# Patient Record
Sex: Male | Born: 1975 | Race: White | Hispanic: No | Marital: Married | Smoking: Former smoker
Health system: Southern US, Community
[De-identification: ages and names within clinical notes are randomized; demographics above are authoritative.]

## PROBLEM LIST (undated history)

## (undated) DIAGNOSIS — M19042 Primary osteoarthritis, left hand: Secondary | ICD-10-CM

## (undated) DIAGNOSIS — M722 Plantar fascial fibromatosis: Secondary | ICD-10-CM

## (undated) DIAGNOSIS — M5134 Other intervertebral disc degeneration, thoracic region: Secondary | ICD-10-CM

## (undated) DIAGNOSIS — K529 Noninfective gastroenteritis and colitis, unspecified: Secondary | ICD-10-CM

## (undated) DIAGNOSIS — F419 Anxiety disorder, unspecified: Secondary | ICD-10-CM

## (undated) DIAGNOSIS — M5136 Other intervertebral disc degeneration, lumbar region: Principal | ICD-10-CM

## (undated) DIAGNOSIS — S82899A Other fracture of unspecified lower leg, initial encounter for closed fracture: Secondary | ICD-10-CM

## (undated) DIAGNOSIS — M19041 Primary osteoarthritis, right hand: Secondary | ICD-10-CM

## (undated) DIAGNOSIS — M5030 Other cervical disc degeneration, unspecified cervical region: Secondary | ICD-10-CM

## (undated) DIAGNOSIS — I10 Essential (primary) hypertension: Secondary | ICD-10-CM

## (undated) HISTORY — PX: ANKLE FRACTURE SURGERY: SHX122

## (undated) HISTORY — DX: Other fracture of unspecified lower leg, initial encounter for closed fracture: S82.899A

## (undated) HISTORY — DX: Essential (primary) hypertension: I10

## (undated) HISTORY — DX: Other intervertebral disc degeneration, lumbar region: M51.36

## (undated) HISTORY — DX: Noninfective gastroenteritis and colitis, unspecified: K52.9

## (undated) HISTORY — DX: Primary osteoarthritis, left hand: M19.042

## (undated) HISTORY — DX: Anxiety disorder, unspecified: F41.9

## (undated) HISTORY — DX: Primary osteoarthritis, right hand: M19.041

## (undated) HISTORY — DX: Other cervical disc degeneration, unspecified cervical region: M50.30

## (undated) HISTORY — DX: Other intervertebral disc degeneration, thoracic region: M51.34

## (undated) HISTORY — DX: Plantar fascial fibromatosis: M72.2

---

## 1997-09-10 DIAGNOSIS — S82899A Other fracture of unspecified lower leg, initial encounter for closed fracture: Secondary | ICD-10-CM

## 1997-09-10 HISTORY — DX: Other fracture of unspecified lower leg, initial encounter for closed fracture: S82.899A

## 2011-02-25 ENCOUNTER — Emergency Department (HOSPITAL_COMMUNITY): Payer: Managed Care, Other (non HMO)

## 2011-02-25 ENCOUNTER — Emergency Department (HOSPITAL_COMMUNITY)
Admission: EM | Admit: 2011-02-25 | Discharge: 2011-02-26 | Disposition: A | Discharge status: Home / Self Care | Payer: Managed Care, Other (non HMO) | Attending: Emergency Medicine | Admitting: Emergency Medicine

## 2011-02-25 DIAGNOSIS — N5089 Other specified disorders of the male genital organs: Secondary | ICD-10-CM | POA: Insufficient documentation

## 2011-02-25 DIAGNOSIS — M129 Arthropathy, unspecified: Secondary | ICD-10-CM | POA: Insufficient documentation

## 2011-02-25 DIAGNOSIS — Z79899 Other long term (current) drug therapy: Secondary | ICD-10-CM | POA: Insufficient documentation

## 2011-02-25 DIAGNOSIS — N1330 Unspecified hydronephrosis: Secondary | ICD-10-CM | POA: Insufficient documentation

## 2011-02-25 DIAGNOSIS — F319 Bipolar disorder, unspecified: Secondary | ICD-10-CM | POA: Insufficient documentation

## 2011-02-25 DIAGNOSIS — N201 Calculus of ureter: Secondary | ICD-10-CM | POA: Insufficient documentation

## 2011-02-25 DIAGNOSIS — R112 Nausea with vomiting, unspecified: Secondary | ICD-10-CM | POA: Insufficient documentation

## 2011-02-25 DIAGNOSIS — R109 Unspecified abdominal pain: Secondary | ICD-10-CM | POA: Insufficient documentation

## 2011-02-25 DIAGNOSIS — N509 Disorder of male genital organs, unspecified: Secondary | ICD-10-CM | POA: Insufficient documentation

## 2011-02-25 LAB — BASIC METABOLIC PANEL
BUN: 15 mg/dL (ref 6–23)
CO2: 28 mEq/L (ref 19–32)
Calcium: 8.9 mg/dL (ref 8.4–10.5)
Chloride: 104 mEq/L (ref 96–112)
Creatinine, Ser: 0.74 mg/dL (ref 0.50–1.35)
Glucose, Bld: 98 mg/dL (ref 70–99)

## 2011-02-25 LAB — CBC
Hemoglobin: 14.4 g/dL (ref 13.0–17.0)
MCH: 29 pg (ref 26.0–34.0)
MCHC: 35.2 g/dL (ref 30.0–36.0)
MCV: 82.5 fL (ref 78.0–100.0)
Platelets: 196 10*3/uL (ref 150–400)
RBC: 4.96 MIL/uL (ref 4.22–5.81)

## 2011-02-25 LAB — DIFFERENTIAL
Basophils Relative: 0 % (ref 0–1)
Eosinophils Absolute: 0.1 10*3/uL (ref 0.0–0.7)
Lymphs Abs: 1.3 10*3/uL (ref 0.7–4.0)
Monocytes Absolute: 0.4 10*3/uL (ref 0.1–1.0)
Monocytes Relative: 4 % (ref 3–12)
Neutrophils Relative %: 80 % — ABNORMAL HIGH (ref 43–77)

## 2011-02-25 LAB — URINALYSIS, ROUTINE W REFLEX MICROSCOPIC
Bilirubin Urine: NEGATIVE
Glucose, UA: NEGATIVE mg/dL
Ketones, ur: NEGATIVE mg/dL
Protein, ur: NEGATIVE mg/dL
pH: 7.5 (ref 5.0–8.0)

## 2011-02-26 MED ORDER — IOHEXOL 300 MG/ML  SOLN
100.0000 mL | Freq: Once | INTRAMUSCULAR | Status: AC | PRN
  Administered 2011-02-26: 100 mL via INTRAVENOUS

## 2011-12-15 IMAGING — US US SCROTUM
1 series · 14 of 25 positions shown · non-contrast
Comparison: None.

CLINICAL DATA: Right testicular pain.

SCROTAL ULTRASOUND
DOPPLER ULTRASOUND OF THE TESTICLES
TECHNIQUE: Complete ultrasound examination of the testicles,
epididymis, and other scrotal structures was performed.  Color and
spectral Doppler ultrasound were also utilized to evaluate blood
flow to the testicles.

[Series 1: us scrotum · 0.08mm/px · 14 of 36 slices shown]
[im 1/36]
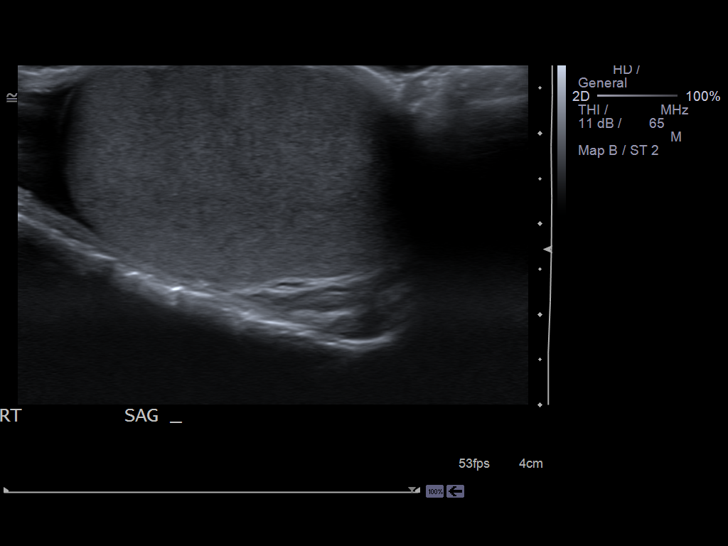
[im 3/36]
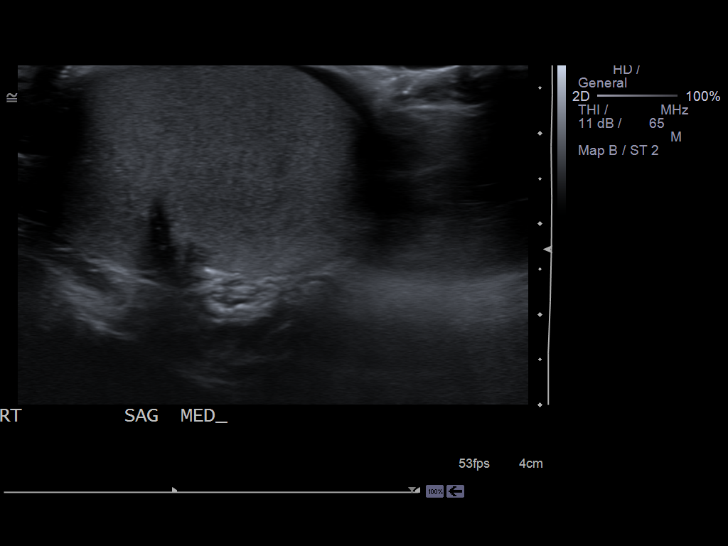
[im 6/36]
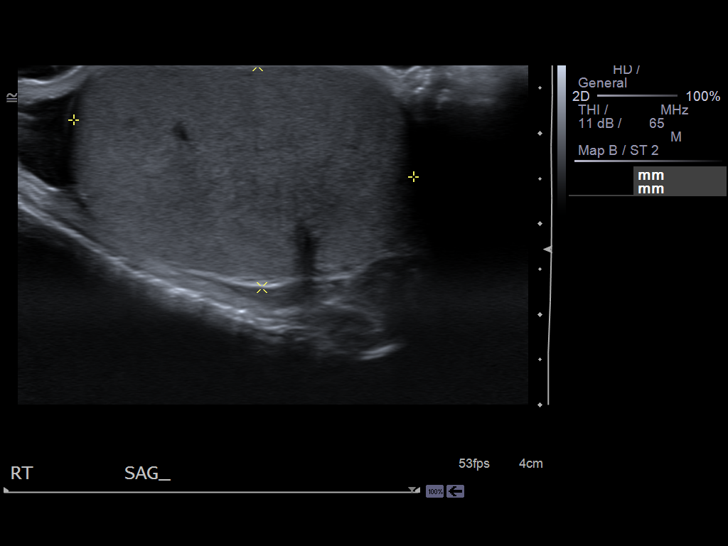
[im 9/36]
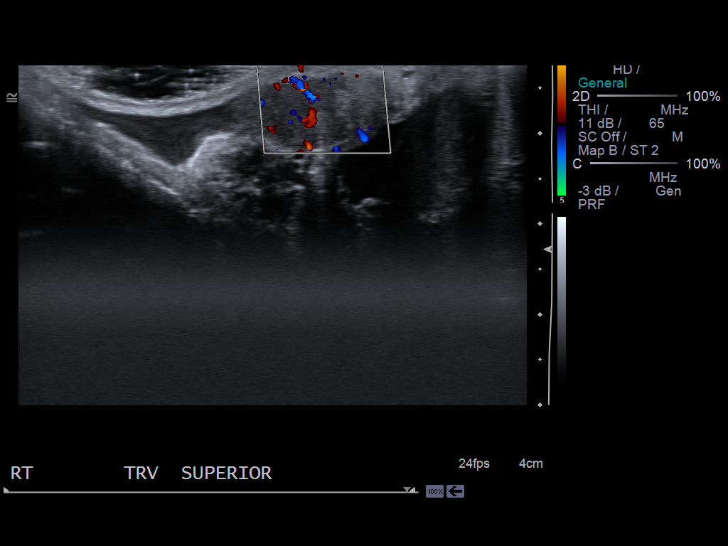
[im 12/36]
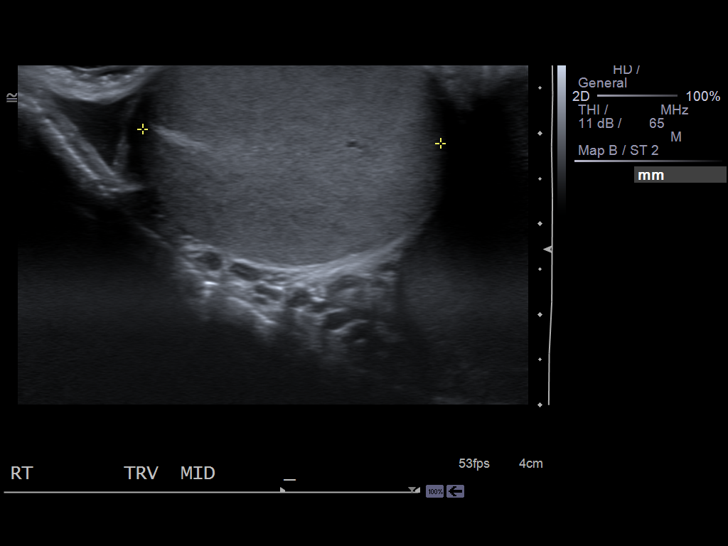
[im 14/36]
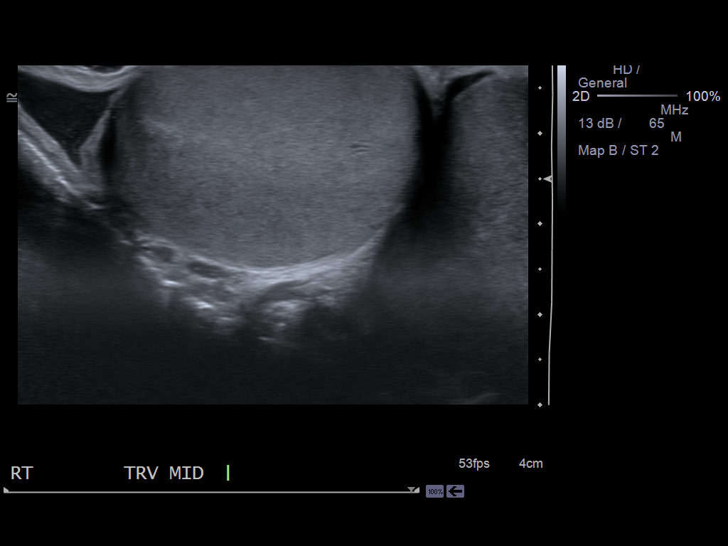
[im 17/36]
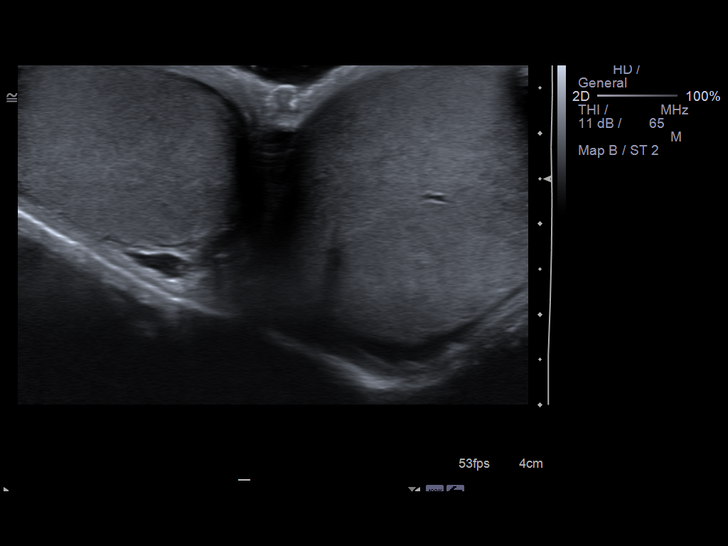
[im 19/36]
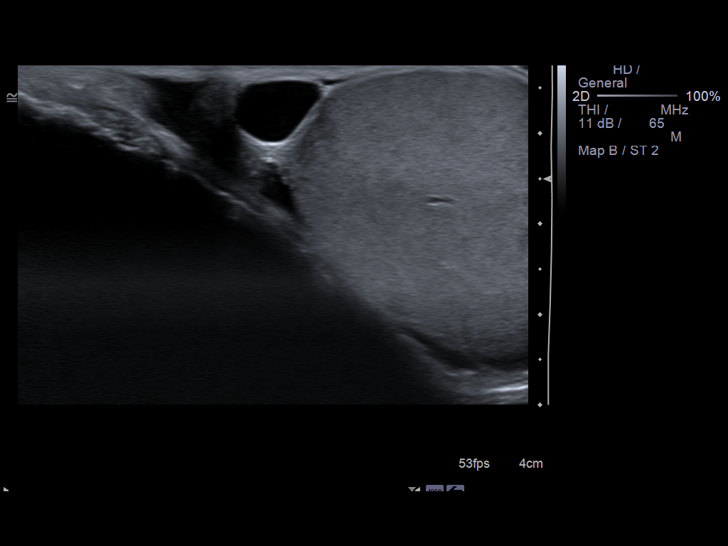
[im 22/36]
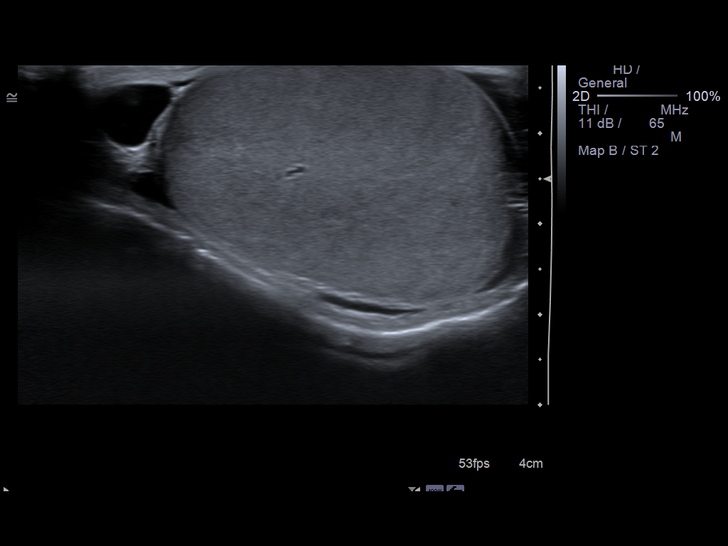
[im 24/36]
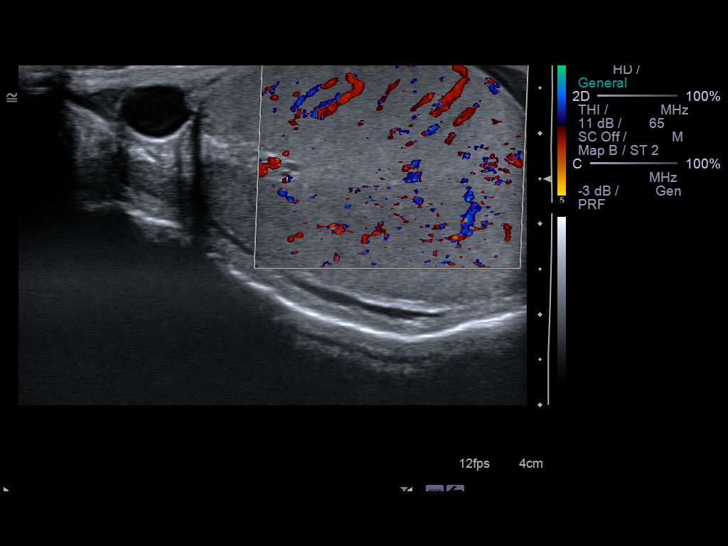
[im 27/36]
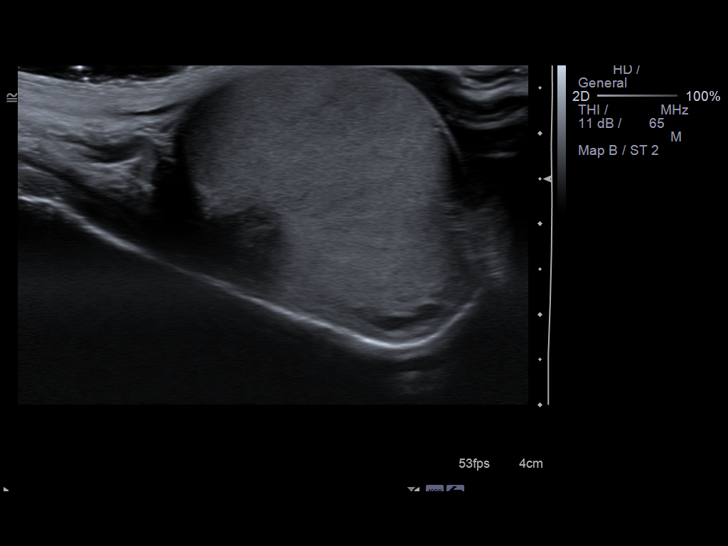
[im 30/36]
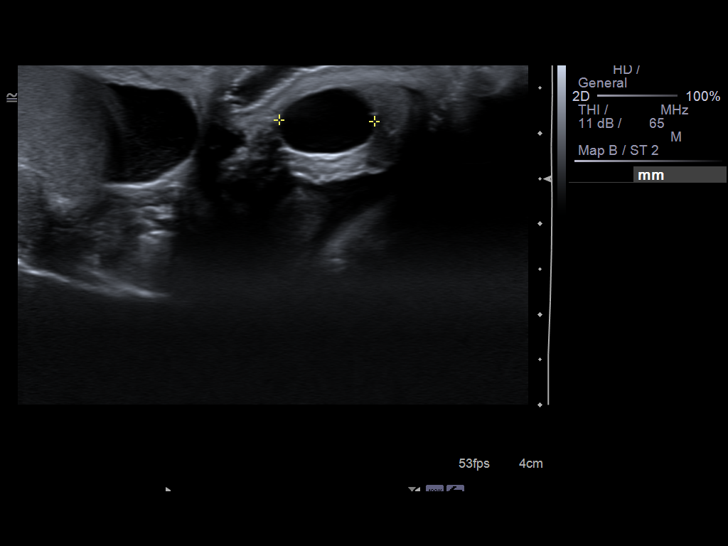
[im 33/36]
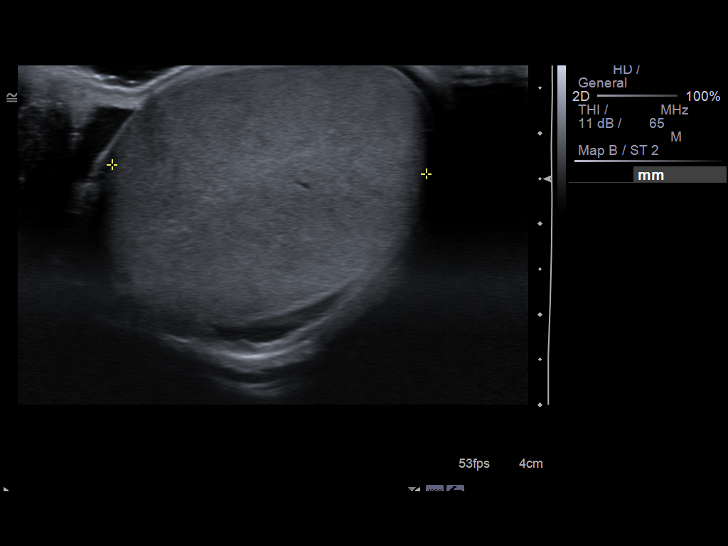
[im 36/36]
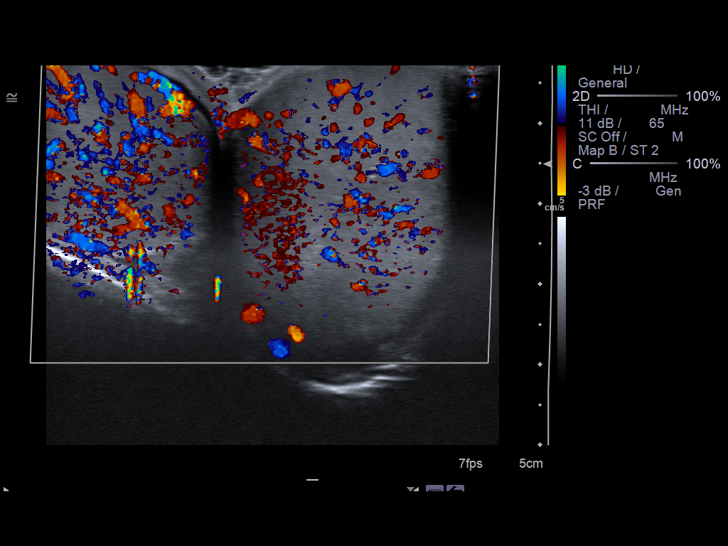

[14 of 25 positions shown; findings below may reference images not displayed]

FINDINGS: Right testis:  Measures 3.8 x 2.5 x 3.3 cm.  The testicle
demonstrates homogeneous, normal echotexture.

Left testis:  Measures 4.2 x 2.6 x 3.5 cm.  The testicle
demonstrates homogeneous, normal echotexture.

Right epididymis:  Normal in size and appearance.

Left epididymis:  Small cyst in the head of the left epididymis is
noted.  Left epididymis otherwise unremarkable.

Hydocele:  present/absent.

Varicocele:  present/absent.

Pulsed Doppler interrogation of both testes demonstrates low
resistance flow bilaterally.
IMPRESSION: No acute finding or finding to explain the patient's symptoms.

## 2011-12-15 IMAGING — CT CT ABD-PELV W/ CM
3 of 4 series · 14 of 32 positions shown, 19 images · IV contrast (water & 100ml omni 300)
Comparison: None.

CLINICAL DATA: Right side abdominal pain.

CT ABDOMEN AND PELVIS WITH CONTRAST
TECHNIQUE: Multidetector CT imaging of the abdomen and pelvis was
performed following the standard protocol during bolus
administration of intravenous contrast.
Contrast: 100 ml 4mnipaque-MFF.

[Series 2: routine abdomen · axial · 0.74mm/px · z∈[-423,-153]mm · 4 of 90 slices shown, 9 images]
[im 18/90  soft-tissue]
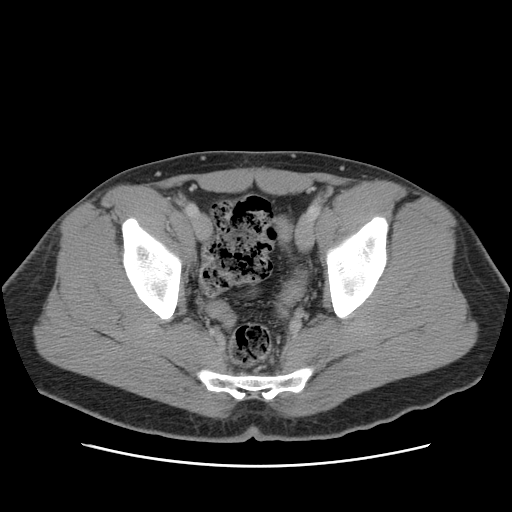
[im 18/90  lung]
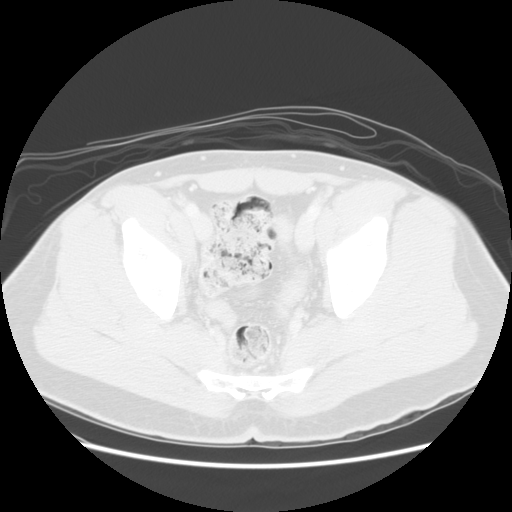
[im 18/90  bone]
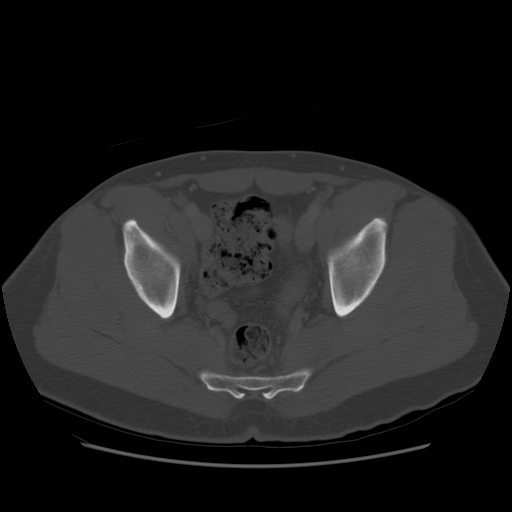
[im 36/90  soft-tissue]
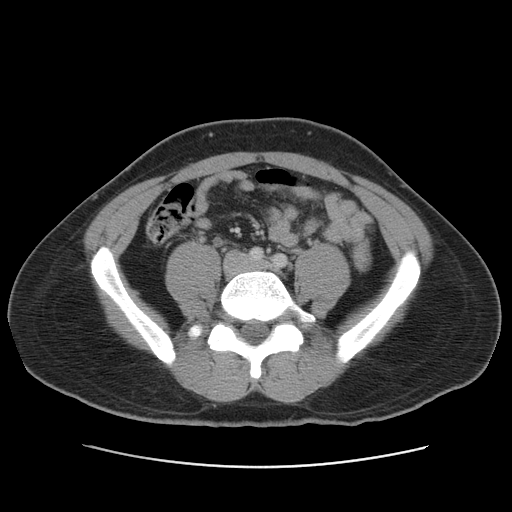
[im 36/90  lung]
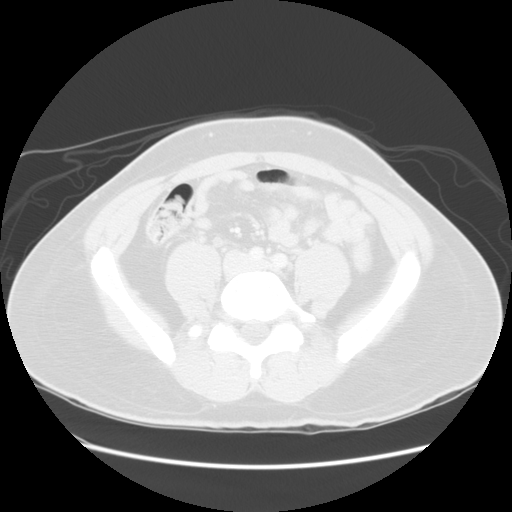
[im 54/90  soft-tissue]
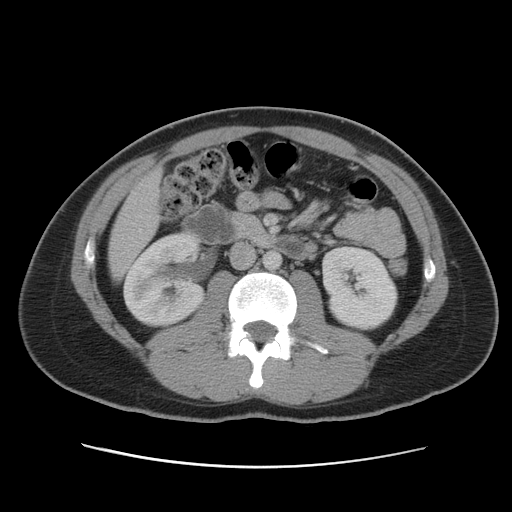
[im 54/90  lung]
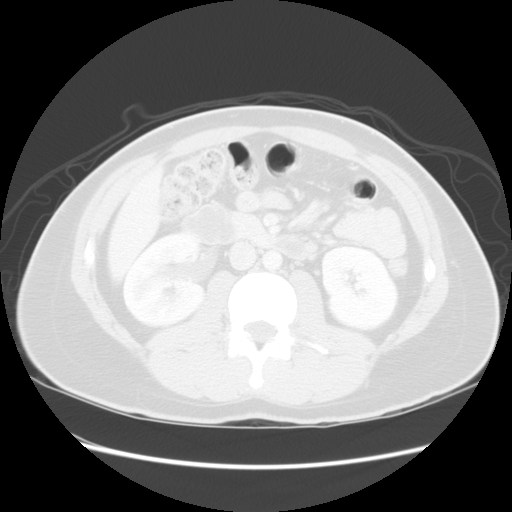
[im 72/90  soft-tissue]
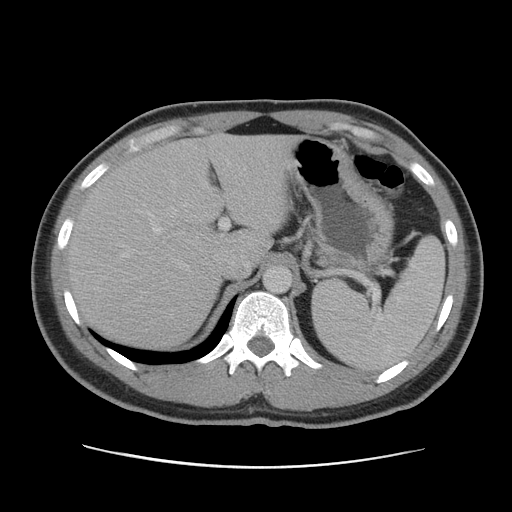
[im 72/90  lung]
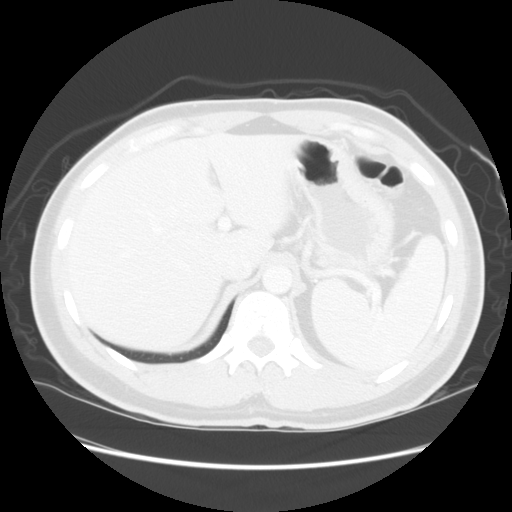

[Series 400: reformatted · coronal · 0.94mm/px · 2 of 132 slices shown (1 of 2)]
[im 15/132  soft-tissue]
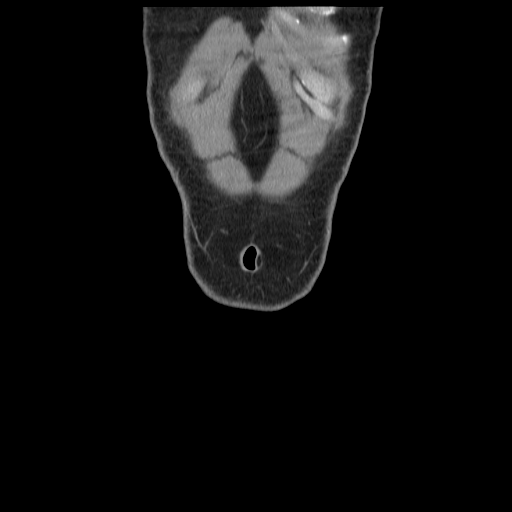
[im 30/132  soft-tissue]
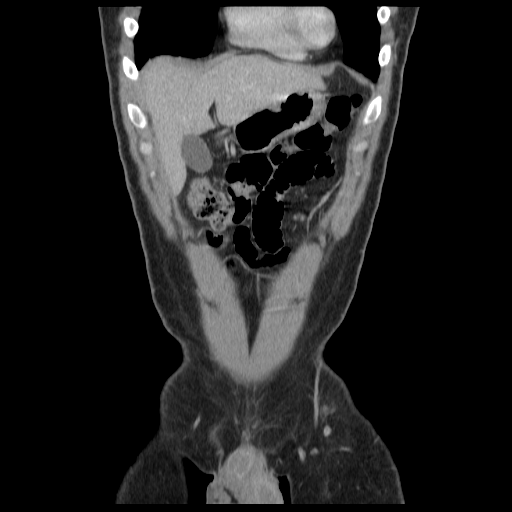

[Series 401: reformatted · sagittal · 0.94mm/px · 8 of 187 slices shown (2 of 2)]
[im 15/187  soft-tissue]
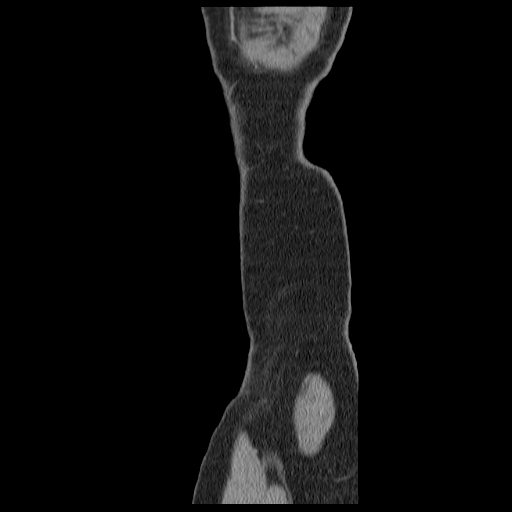
[im 43/187  soft-tissue]
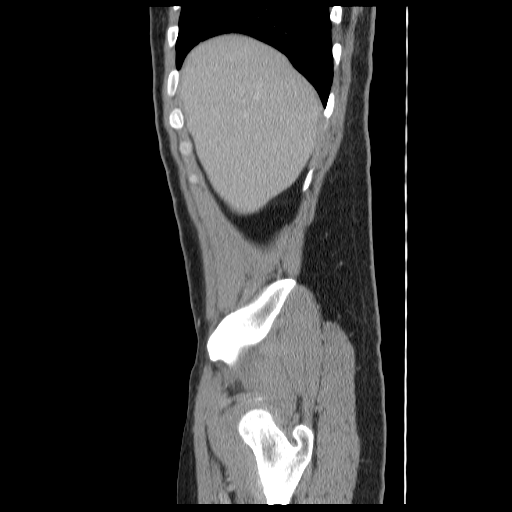
[im 58/187  soft-tissue]
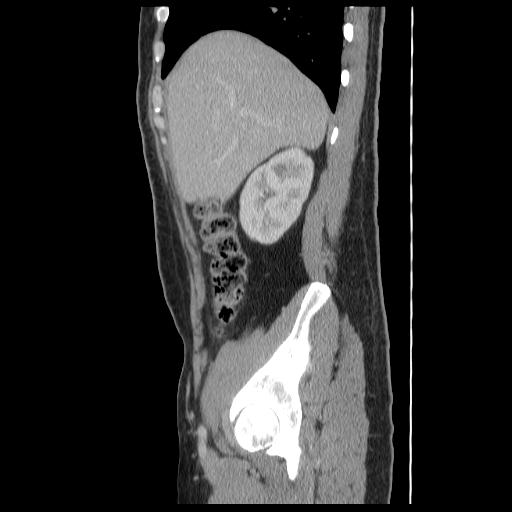
[im 86/187  soft-tissue]
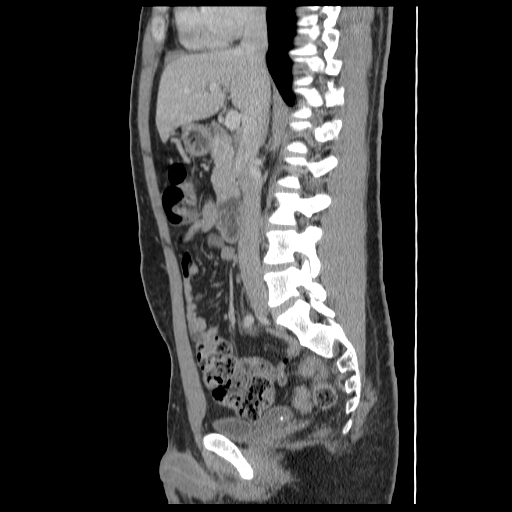
[im 101/187  soft-tissue]
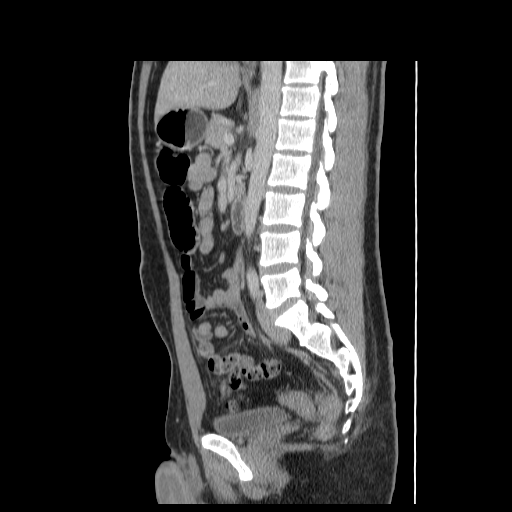
[im 129/187  soft-tissue]
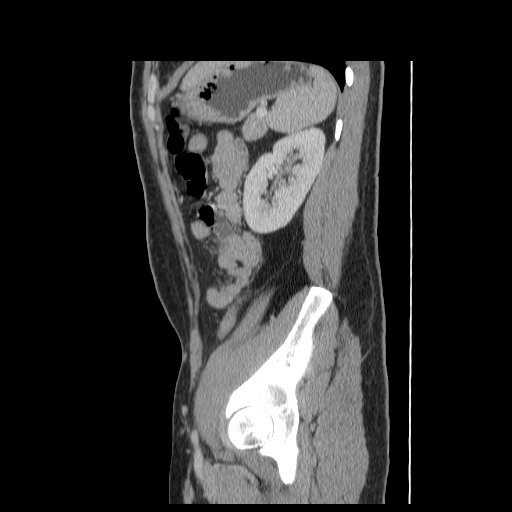
[im 144/187  soft-tissue]
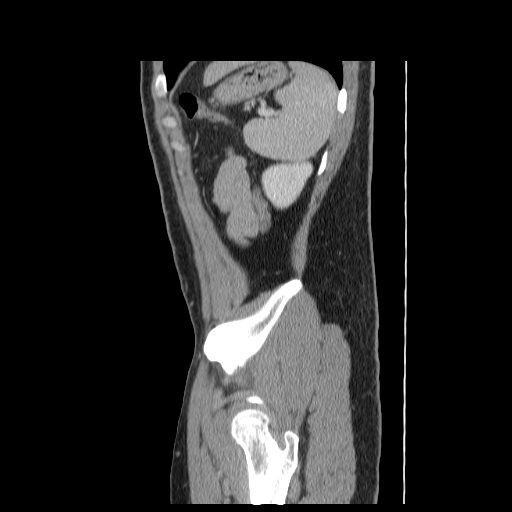
[im 172/187  soft-tissue]
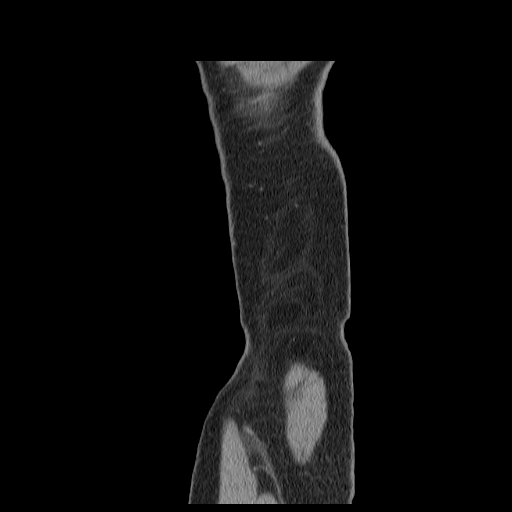

[14 of 32 positions shown; findings below may reference images not displayed]

FINDINGS: Lung bases are clear.  No pleural or pericardial
effusion.

The patient has mild to moderate right hydronephrosis due to a
cm stone at the right ureterovesicle junction.  Also seen is a
punctate nonobstructing stone in the upper pole of the right
kidney.  No other urinary tract stones are identified.

The liver, gallbladder, spleen, adrenal glands and pancreas all
appear normal.  No lymphadenopathy or fluid.  The stomach and small
and large bowel appear normal.  The appendix is also normal in
appearance.  No focal bony abnormality.
IMPRESSION: Mild to moderate right hydronephrosis due to a 0.3 cm right UVJ
stone.  Punctate nonobstructing stone upper pole right kidney is
also noted.  The examination is otherwise negative.

## 2016-04-25 ENCOUNTER — Encounter: Payer: Self-pay | Admitting: Rheumatology

## 2016-05-03 LAB — HEPATIC FUNCTION PANEL
ALT: 12 U/L (ref 10–40)
AST: 13 U/L — AB (ref 14–40)
Alkaline Phosphatase: 78 U/L (ref 25–125)
Bilirubin, Total: 0.4 mg/dL

## 2016-05-03 LAB — BASIC METABOLIC PANEL
BUN: 10 mg/dL (ref 4–21)
CREATININE: 0.9 mg/dL (ref 0.6–1.3)
Glucose: 86 mg/dL
POTASSIUM: 4.1 mmol/L (ref 3.4–5.3)
Sodium: 139 mmol/L (ref 137–147)

## 2016-06-20 ENCOUNTER — Ambulatory Visit (INDEPENDENT_AMBULATORY_CARE_PROVIDER_SITE_OTHER): Payer: BLUE CROSS/BLUE SHIELD | Admitting: Rheumatology

## 2016-06-20 DIAGNOSIS — M791 Myalgia: Secondary | ICD-10-CM

## 2016-06-20 DIAGNOSIS — M542 Cervicalgia: Secondary | ICD-10-CM

## 2016-06-20 DIAGNOSIS — M545 Low back pain: Secondary | ICD-10-CM

## 2016-06-20 DIAGNOSIS — M5030 Other cervical disc degeneration, unspecified cervical region: Secondary | ICD-10-CM

## 2016-06-20 DIAGNOSIS — M546 Pain in thoracic spine: Secondary | ICD-10-CM

## 2016-07-10 ENCOUNTER — Encounter: Payer: Self-pay | Admitting: *Deleted

## 2016-07-10 DIAGNOSIS — M5134 Other intervertebral disc degeneration, thoracic region: Secondary | ICD-10-CM

## 2016-07-10 DIAGNOSIS — F419 Anxiety disorder, unspecified: Secondary | ICD-10-CM

## 2016-07-10 DIAGNOSIS — I10 Essential (primary) hypertension: Secondary | ICD-10-CM

## 2016-07-10 DIAGNOSIS — K529 Noninfective gastroenteritis and colitis, unspecified: Secondary | ICD-10-CM

## 2016-07-10 DIAGNOSIS — M722 Plantar fascial fibromatosis: Secondary | ICD-10-CM

## 2016-07-10 DIAGNOSIS — Z8739 Personal history of other diseases of the musculoskeletal system and connective tissue: Secondary | ICD-10-CM | POA: Insufficient documentation

## 2016-07-10 DIAGNOSIS — M19041 Primary osteoarthritis, right hand: Secondary | ICD-10-CM | POA: Insufficient documentation

## 2016-07-10 DIAGNOSIS — M51369 Other intervertebral disc degeneration, lumbar region without mention of lumbar back pain or lower extremity pain: Secondary | ICD-10-CM

## 2016-07-10 DIAGNOSIS — M5136 Other intervertebral disc degeneration, lumbar region: Secondary | ICD-10-CM

## 2016-07-10 DIAGNOSIS — M19042 Primary osteoarthritis, left hand: Secondary | ICD-10-CM

## 2016-07-10 DIAGNOSIS — M5030 Other cervical disc degeneration, unspecified cervical region: Secondary | ICD-10-CM

## 2016-07-10 HISTORY — DX: Other cervical disc degeneration, unspecified cervical region: M50.30

## 2016-07-10 HISTORY — DX: Primary osteoarthritis, right hand: M19.041

## 2016-07-10 HISTORY — DX: Noninfective gastroenteritis and colitis, unspecified: K52.9

## 2016-07-10 HISTORY — DX: Other intervertebral disc degeneration, thoracic region: M51.34

## 2016-07-10 HISTORY — DX: Primary osteoarthritis, right hand: M19.042

## 2016-07-10 HISTORY — DX: Other intervertebral disc degeneration, lumbar region: M51.36

## 2016-07-10 HISTORY — DX: Other intervertebral disc degeneration, lumbar region without mention of lumbar back pain or lower extremity pain: M51.369

## 2016-07-10 HISTORY — DX: Essential (primary) hypertension: I10

## 2016-07-10 HISTORY — DX: Plantar fascial fibromatosis: M72.2

## 2016-07-10 HISTORY — DX: Anxiety disorder, unspecified: F41.9

## 2016-07-10 NOTE — Progress Notes (Signed)
*  IMAGE* Office Visit Note  Patient: Dillon Price             Date of Birth: [birth year]           MRN: [id number]             PCP: No PCP Per Patient Referring: Mann,Jyothi MD Visit Date: 07/11/2016 Occupation: Materials engineer restoration    Subjective:  Pain bilateral hands  History of Present Illness: Dillon Price is a 40 y.o. male with history of multiple arthralgias he continues to have pain in his bilateral hands and came here to get ultrasound examination of bilateral hands today.   No Rheumatology ROS completed.   Objective:  Physical Exam - not performed  CDAI Exam: No CDAI exam completed.    Investigation: Findings:  04/25/2016 CMP normal, ESR 1, CK 75, TSH normal, RF negative, CCP negative, HLA-B27 negative, HIV negative, hepatitis negative, G6PD normal, SPEP normal, TB negative, IFE showed IgG lambda monoclonal band present    Imaging: [place] Extrem Up Bilat Comp  Result Date: 07/11/2016 Ultrasound of bilateral hands was performed per your recommendations. Using 12 MHz transducer, grayscale and power Doppler bilateral second and third and fifth MCP joints and bilateral wrist joints both dorsal and volar aspects were evaluated. The findings were he had narrowing of the left second PIP joint there was no synovitis noted in all MCP joints and the wrist joints. The right median nerve was 0.12 cm squares in the left median nerve was 0.09 cm squares which were within normal limits. Impression these findings were consistent with mild osteoarthritis no synovitis was noted. Median nerves are within normal limits.   Procedures:  No procedures performed   He has delta monoclonal band IFE exam. I will make a referral to hematology for evaluation.    Orders: Orders Placed This Encounter  Procedures  . [place] Extrem Up Bilat Comp  . Ambulatory referral to Hematology / Oncology    .  Follow-Up Instructions: As scheduled  Dillon Merino, MD

## 2016-07-11 ENCOUNTER — Ambulatory Visit (INDEPENDENT_AMBULATORY_CARE_PROVIDER_SITE_OTHER): Payer: BLUE CROSS/BLUE SHIELD | Admitting: Rheumatology

## 2016-07-11 ENCOUNTER — Inpatient Hospital Stay (INDEPENDENT_AMBULATORY_CARE_PROVIDER_SITE_OTHER): Payer: BLUE CROSS/BLUE SHIELD

## 2016-07-11 DIAGNOSIS — M19041 Primary osteoarthritis, right hand: Secondary | ICD-10-CM | POA: Diagnosis not present

## 2016-07-11 DIAGNOSIS — M5030 Other cervical disc degeneration, unspecified cervical region: Secondary | ICD-10-CM

## 2016-07-11 DIAGNOSIS — M722 Plantar fascial fibromatosis: Secondary | ICD-10-CM

## 2016-07-11 DIAGNOSIS — R778 Other specified abnormalities of plasma proteins: Secondary | ICD-10-CM

## 2016-07-11 DIAGNOSIS — M79642 Pain in left hand: Secondary | ICD-10-CM | POA: Diagnosis not present

## 2016-07-11 DIAGNOSIS — M79641 Pain in right hand: Secondary | ICD-10-CM | POA: Diagnosis not present

## 2016-07-11 DIAGNOSIS — M19042 Primary osteoarthritis, left hand: Secondary | ICD-10-CM

## 2016-07-11 DIAGNOSIS — Z8739 Personal history of other diseases of the musculoskeletal system and connective tissue: Secondary | ICD-10-CM

## 2016-07-11 DIAGNOSIS — M5134 Other intervertebral disc degeneration, thoracic region: Secondary | ICD-10-CM

## 2016-07-16 ENCOUNTER — Telehealth: Payer: Self-pay | Admitting: Hematology and Oncology

## 2016-07-16 ENCOUNTER — Encounter: Payer: Self-pay | Admitting: Hematology and Oncology

## 2016-07-16 NOTE — Telephone Encounter (Signed)
Pt confirmed appt, verified demo and insurance, insurance is [employer], mailed pt letter, in [employer] referring provider appt date/time.

## 2016-08-01 ENCOUNTER — Ambulatory Visit (INDEPENDENT_AMBULATORY_CARE_PROVIDER_SITE_OTHER): Payer: BLUE CROSS/BLUE SHIELD | Admitting: Rheumatology

## 2016-08-01 ENCOUNTER — Encounter: Payer: Self-pay | Admitting: Rheumatology

## 2016-08-01 VITALS — BP 155/105 | HR 81 | Resp 14 | Ht 72.0 in | Wt 205.0 lb

## 2016-08-01 DIAGNOSIS — M5030 Other cervical disc degeneration, unspecified cervical region: Secondary | ICD-10-CM

## 2016-08-01 DIAGNOSIS — M19041 Primary osteoarthritis, right hand: Secondary | ICD-10-CM

## 2016-08-01 DIAGNOSIS — M19042 Primary osteoarthritis, left hand: Secondary | ICD-10-CM

## 2016-08-01 DIAGNOSIS — M47816 Spondylosis without myelopathy or radiculopathy, lumbar region: Secondary | ICD-10-CM | POA: Diagnosis not present

## 2016-08-01 DIAGNOSIS — M47812 Spondylosis without myelopathy or radiculopathy, cervical region: Secondary | ICD-10-CM

## 2016-08-01 DIAGNOSIS — M722 Plantar fascial fibromatosis: Secondary | ICD-10-CM

## 2016-08-01 MED ORDER — LIDOCAINE HCL 1 % IJ SOLN
0.5000 mL | INTRAMUSCULAR | Status: AC | PRN
  Administered 2016-08-01: .5 mL

## 2016-08-01 MED ORDER — TRIAMCINOLONE ACETONIDE 40 MG/ML IJ SUSP
20.0000 mg | INTRAMUSCULAR | Status: AC | PRN
  Administered 2016-08-01: 20 mg

## 2016-08-01 MED ORDER — DICLOFENAC SODIUM 1 % TD GEL: TRANSDERMAL | 3 refills | Status: DC

## 2016-08-01 NOTE — Progress Notes (Signed)
Office Visit Note  Patient: Dillon Price             Date of Birth: 10/13/1975           MRN: [id number]             PCP: No PCP Per Patient Referring: No ref. provider found Visit Date: 08/01/2016 Occupation: @GUAROCC @    Subjective:  Follow-up of the Left Foot and Follow-up (Would like to have a cort. injection.) Follow-up on osteoarthritis and left plantar fasciitis with significant heel pain  History of Present Illness: Dillon Price is a 40 y.o. male  Last seen in our office on 06/20/2016 for regular follow-up. Was doing well on that visit. We gave the patient a cortisone injection left heel approximately August 2017 and patient did well until approximately a week or 2 ago. He rates his discomfort as 8-9 on a scale of 0-10 and is requesting a cortisone injection. After discussing the possibility of tendon rupture and other side effects from frequent cortisone injection in the heel patient elected to move forward with getting an injection today.  Patient is doing well overall otherwise. His autoimmune workup was negative back in August 2017.   Activities of Daily Living:  Patient reports morning stiffness for 15 minute.   Patient Denies nocturnal pain.  Difficulty dressing/grooming: Denies Difficulty climbing stairs: Denies Difficulty getting out of chair: Denies Difficulty using hands for taps, buttons, cutlery, and/or writing: Denies   Review of Systems  Constitutional: Negative for fatigue.  HENT: Negative for mouth sores and mouth dryness.   Eyes: Negative for dryness.  Respiratory: Negative for shortness of breath.   Gastrointestinal: Negative for constipation and diarrhea.  Musculoskeletal: Negative for myalgias and myalgias.  Skin: Negative for sensitivity to sunlight.  Neurological: Negative for memory loss.  Psychiatric/Behavioral: Negative for sleep disturbance.    PMFS History:  Patient Active Problem List   Diagnosis Date Noted  . Osteoarthritis of both  hands 07/10/2016  . DDD (degenerative disc disease), thoracic 07/10/2016  . DDD (degenerative disc disease), cervical 07/10/2016  . Plantar fasciitis, left 07/10/2016  . Anxiety 07/10/2016  . Hypertension 07/10/2016  . Chronic diarrhea 07/10/2016  . History of juvenile rheumatoid arthritis 07/10/2016    Past Medical History:  Diagnosis Date  . Ankle fracture 1999   right   . Anxiety 07/10/2016  . Chronic diarrhea 07/10/2016  . DDD (degenerative disc disease), cervical 07/10/2016  . DDD (degenerative disc disease), lumbar 07/10/2016  . DDD (degenerative disc disease), thoracic 07/10/2016  . Hypertension 07/10/2016  . Osteoarthritis of both hands 07/10/2016   Mild   . Plantar fasciitis, left 07/10/2016    History reviewed. No pertinent family history. Past Surgical History:  Procedure Laterality Date  . ANKLE FRACTURE SURGERY     Social History   Social History Narrative  . No narrative on file     Objective: Vital Signs: BP (!) 155/105   Pulse 81   Resp 14   Ht 6' (1.829 m)   Wt 205 lb (93 kg)   BMI 27.80 kg/m    Physical Exam  Constitutional: He is oriented to person, place, and time. He appears well-developed and well-nourished.  HENT:  Head: Normocephalic and atraumatic.  Eyes: Conjunctivae and EOM are normal. Pupils are equal, round, and reactive to light.  Neck: Normal range of motion. Neck supple.  Cardiovascular: Normal rate, regular rhythm and normal heart sounds.  Exam reveals no gallop and no friction rub.   No  murmur heard. Pulmonary/Chest: Effort normal and breath sounds normal. No respiratory distress. He has no wheezes. He has no rales. He exhibits no tenderness.  Abdominal: Soft. He exhibits no distension and no mass. There is no tenderness. There is no guarding.  Musculoskeletal: Normal range of motion.  Lymphadenopathy:    He has no cervical adenopathy.  Neurological: He is alert and oriented to person, place, and time. He exhibits normal  muscle tone. Coordination normal.  Skin: Skin is warm and dry. Capillary refill takes less than 2 seconds. No rash noted.  Psychiatric: He has a normal mood and affect. His behavior is normal. Judgment and thought content normal.  Vitals reviewed.    Musculoskeletal Exam:  Full range of motion of all joints Grip strength is equal and strong bilaterally Fiber myalgia tender points are all absent  CDAI Exam: CDAI Homunculus Exam:   Joint Counts:  CDAI Tender Joint count: 0 CDAI Swollen Joint count: 0  Global Assessments:  Patient Global Assessment: 9 Provider Global Assessment: 9  CDAI Calculated Score: 18 No synovitis on examination   Investigation: No additional findings.   Imaging: [place] Extrem Up Bilat Comp  Result Date: 07/11/2016 Ultrasound of bilateral hands was performed per your recommendations. Using 12 MHz transducer, grayscale and power Doppler bilateral second and third and fifth MCP joints and bilateral wrist joints both dorsal and volar aspects were evaluated. The findings were he had narrowing of the left second PIP joint there was no synovitis noted in all MCP joints and the wrist joints. The right median nerve was 0.12 cm squares in the left median nerve was 0.09 cm squares which were within normal limits. Impression: these findings were consistent with mild osteoarthritis no synovitis was noted. Median nerves are within normal limits.   Speciality Comments: No specialty comments available.    Procedures:  Foot Inj Date/Time: 08/01/2016 5:06 PM Performed by: Eliezer Lofts Authorized by: Eliezer Lofts   Site marked: the procedure site was marked   Timeout: prior to procedure the correct patient, procedure, and site was verified   Indications:  Fasciitis Condition: Plantar Fasciitis   Location: left plantar fascia muscle   Prep: patient was prepped and draped in usual sterile fashion   Needle Size:  27 G Approach:  Dorsal Medications:  0.5 mL  lidocaine 1 %; 20 mg triamcinolone acetonide 40 MG/ML Patient Tolerance:  Patient tolerated the procedure well with no immediate complications   Left ear with plantar fasciitis Patient is requesting cortisone injection Last injection was about 3 months ago which we did well until about 2 weeks ago After informed consent was obtained and we discuss tender rupture possibilities as well as frequent injections of cortisone into the heel We prepped the area carefully with Betadine Injected with 20 mg of Kenalog mixed with 0.5 and also 1% lidocaine without epinephrine. Patient tolerated procedure well. There were no complications   Allergies: Patient has no known allergies.   Assessment / Plan:     Visit Diagnoses: Osteoarthritis of both hands, unspecified osteoarthritis type  Plantar fasciitis of left foot - Plan: Ambulatory referral to Physical Therapy  DJD (degenerative joint disease), cervical  Osteoarthritis of lumbar spine, unspecified spinal osteoarthritis complication status   Left heel plantar fasciitis Per his request and after informed consent was obtained, the left heel was prepped in a sterile fashion and injected with 0.5 mL of 1% lidocaine and 20 mg of Kenalog, which he tolerated well.   Patient was given physical therapy prescription  for outpatient physical therapy for left plantar fasciitis/heel spur/heel pain. Evaluate and treat. He can see physical therapist of his choice.  Orders: Orders Placed This Encounter  Procedures  . Foot Injection  . Ambulatory referral to Physical Therapy   Meds ordered this encounter  Medications  . diclofenac sodium (VOLTAREN) 1 % GEL    Sig: Voltaren Gel 3 grams to 3 large joints upto TID 3 TUBES with 3 refills    Dispense:  3 Tube    Refill:  3    Voltaren Gel 3 grams to 3 large joints upto TID 3 TUBES with 3 refills    Order Specific Question:   Supervising Provider    Answer:   Lyda Perone    Face-to-face time  spent with patient was 40 minutes. 50% of time was spent in counseling and coordination of care.  Follow-Up Instructions: Return in about 6 months (around 01/29/2017) for Left Plantar Fasciitis/heel spur; oa hand; ddd c-t-l spine; .   Eliezer Lofts, PA-C

## 2016-08-20 ENCOUNTER — Ambulatory Visit (HOSPITAL_BASED_OUTPATIENT_CLINIC_OR_DEPARTMENT_OTHER): Payer: 59 | Admitting: Hematology and Oncology

## 2016-08-20 ENCOUNTER — Encounter: Payer: Self-pay | Admitting: Hematology and Oncology

## 2016-08-20 ENCOUNTER — Encounter: Payer: Self-pay | Admitting: *Deleted

## 2016-08-20 DIAGNOSIS — D472 Monoclonal gammopathy: Secondary | ICD-10-CM | POA: Diagnosis not present

## 2016-08-20 NOTE — Progress Notes (Signed)
[facility] CONSULT NOTE  Patient Care Team: No Pcp Per Patient as PCP - General (General Practice)  CHIEF COMPLAINTS/PURPOSE OF CONSULTATION:  Elevated monoclonal protein  HISTORY OF PRESENTING ILLNESS:  Dillon Price 40 y.o. male is here because of recent diagnosis of elevated monoclonal protein. Patient has severe arthritis in the hands which she has had since he was a child. Patient was recently seen by Dr. Patrecia Pour who performed extensive workup including a serum protein electrophoresis. Final report revealed that he has a 0.7 g of monoclonal protein which is IgG lambda. He was sent to [place] for discussion regarding treatment options for his elevated monoclonal protein.  I reviewed her records extensively and collaborated the history with the patient.  MEDICAL HISTORY:  Past Medical History:  Diagnosis Date  . Ankle fracture 1999   right   . Anxiety 07/10/2016  . Chronic diarrhea 07/10/2016  . DDD (degenerative disc disease), cervical 07/10/2016  . DDD (degenerative disc disease), lumbar 07/10/2016  . DDD (degenerative disc disease), thoracic 07/10/2016  . Hypertension 07/10/2016  . Osteoarthritis of both hands 07/10/2016   Mild   . Plantar fasciitis, left 07/10/2016    SURGICAL HISTORY:Shoulder surgery, right hip replacement Past Surgical History:  Procedure Laterality Date  . 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 FAMILY HISTORY: Paternal grandfather prostate cancer, maternal grandfather leukemia, no other history of any cancers ALLERGIES:  has No Known Allergies.    MEDICATIONS:  Current Outpatient Prescriptions  Medication Sig Dispense Refill  . acetaminophen (TYLENOL) 500 MG tablet Take 500 mg by mouth every 6 (six) hours as needed.    . ALPRAZolam (XANAX) 0.5 MG tablet TAKE 1 TO 2 TABLETS BY MOUTH 4 TIMES A DAY    . diclofenac  sodium (VOLTAREN) 1 % GEL Voltaren Gel 3 grams to 3 large joints upto TID 3 TUBES with 3 refills 3 Tube 3  . ibuprofen (ADVIL,MOTRIN) 200 MG tablet Take 200 mg by mouth every 6 (six) hours as needed.    . lamoTRIgine (LAMICTAL) 200 MG tablet TAKE 2 TABLETS DAILY    . verapamil (CALAN-SR) 120 MG CR tablet   11   No current facility-administered medications for this visit.     REVIEW OF SYSTEMS:   Constitutional: Denies fevers, chills or abnormal night sweats Eyes: Denies blurriness of vision, double vision or watery eyes Ears, nose, mouth, throat, and face: Denies mucositis or sore throat Respiratory: Denies cough, dyspnea or wheezes Cardiovascular: Denies palpitation, chest discomfort or lower extremity swelling Gastrointestinal:  Denies nausea, heartburn or change in bowel habits Skin: Denies abnormal skin rashes Lymphatics: Denies new lymphadenopathy or easy bruising Neurological:Denies numbness, tingling or new weaknesses Behavioral/Psych: Mood is stable, no new changes   All other systems were reviewed with the patient and are negative.  PHYSICAL EXAMINATION: ECOG PERFORMANCE STATUS: 1 - Symptomatic but completely ambulatory  Vitals:   08/20/16 1330  BP: 133/90  Pulse: 98  Resp: 16  Temp: 98 F (36.7 C)   Filed Weights   08/20/16 1330  Weight: 205 lb 1.6 oz (93 kg)    GENERAL:alert, no distress and comfortable SKIN: skin color, texture, turgor are normal, no rashes or significant lesions EYES: normal, conjunctiva are pink and non-injected, sclera clear OROPHARYNX:no exudate, no erythema and lips, buccal mucosa, and tongue normal  NECK: supple, thyroid normal size, non-tender, without nodularity LYMPH:  no  palpable lymphadenopathy in the cervical, axillary or inguinal LUNGS: clear to auscultation and percussion with normal breathing effort HEART: regular rate & rhythm and no murmurs and no lower extremity edema ABDOMEN:abdomen soft, non-tender and normal bowel  sounds Musculoskeletal:no cyanosis of digits and no clubbing  PSYCH: alert & oriented x 3 with fluent speech NEURO: no focal motor/sensory deficits  LABORATORY DATA:  I have reviewed the data as listed Lab Results  Component Value Date   WBC 9.1 02/25/2011   HGB 14.4 02/25/2011   HCT 40.9 02/25/2011   MCV 82.5 02/25/2011   PLT 196 02/25/2011   Lab Results  Component Value Date   NA 139 05/03/2016   K 4.1 05/03/2016   CL 104 02/25/2011   CO2 28 02/25/2011    RADIOGRAPHIC STUDIES: I have personally reviewed the radiological reports and agreed with the findings in the report.  ASSESSMENT AND PLAN:  MGUS (monoclonal gammopathy of unknown significance) MGUS: Incidentally found elevated monoclonal protein of 0.7 g IgG lambda done 04/25/2016 done by Dr.Devashwar as part of the workup for arthritis in his hands.  Pathology counseling: I discussed with the patient the bone marrow pathophysiology of how plasma cell's make antibodies. Monoclonal gammopathy is are conditions were a clonal plasma cell's produces these antibodies and excess without any reason. Since the patient does not have any end organ dysfunction like anemia hemoglobin is less than 10 g, hypercalcemia were calcium levels greater than 10, renal insufficiency, it supports the diagnosis of MGUS.   Workup recommended: 1. Bone survey:  however patient recently had x-rays of his hands, neck, back etc at [facility] office. I will request radiology to make these areas for x-rays.  2. recommendation: Every 6 month blood work for 2 years. If stable then annually thereafter.   Prognosis: Patient understands the risk of transformation from MGUS to myeloma is about 1% per year.   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 He is currently on verapamil. I discussed with him that he needs  to have a primary care physician to adjust her blood pressure medications. He tells me that when he has high blood pressure he gets intense headaches.   Return to clinic in 6 months for follow-up    All questions were answered. The patient knows to call the clinic with any problems, questions or concerns.    Rulon Eisenmenger, MD 08/20/16

## 2016-08-20 NOTE — Assessment & Plan Note (Signed)
MGUS: Incidentally found elevated monoclonal protein of 0.7 g IgG lambda done 04/25/2016 done by Dr.Devashwar as part of the workup for arthritis in his hands.  Pathology counseling: I discussed with the patient the bone marrow pathophysiology of how plasma cell's make antibodies. Monoclonal gammopathy is are conditions were a clonal plasma cell's produces these antibodies and excess without any reason. Since the patient does not have any end organ dysfunction like anemia hemoglobin is less than 10 g, hypercalcemia were calcium levels greater than 10, renal insufficiency, it supports the diagnosis of MGUS.   Workup recommended: 1. Bone survey:  however patient recently had x-rays of his hands, neck, back etc at [facility] office. I will request radiology to make these areas for x-rays.  2. recommendation: Every 6 month blood work for 2 years. If stable then annually thereafter.   Prognosis: Patient understands the risk of transformation from MGUS to myeloma is about 1% per year.   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 He is currently on verapamil. I discussed with him that he needs to have a primary care physician to adjust her blood pressure medications. He tells me that when he has high blood pressure he gets intense headaches.   Return to clinic in 6 months for follow-up

## 2016-12-05 NOTE — Progress Notes (Deleted)
Office Visit Note  Patient: Dillon Price             Date of Birth: [birth year]           MRN: [id number]             PCP: No PCP Per Patient Referring: No ref. provider found Visit Date: 12/13/2016 Occupation: _0 @    Subjective:  No chief complaint on file.   History of Present Illness: Dillon Price is a 41 y.o. male ***   Activities of Daily Living:  Patient reports morning stiffness for *** {minute/hour:19697}.   Patient {ACTIONS;DENIES/REPORTS:21021675::"Denies"} nocturnal pain.  Difficulty dressing/grooming: {ACTIONS;DENIES/REPORTS:21021675::"Denies"} Difficulty climbing stairs: {ACTIONS;DENIES/REPORTS:21021675::"Denies"} Difficulty getting out of chair: {ACTIONS;DENIES/REPORTS:21021675::"Denies"} Difficulty using hands for taps, buttons, cutlery, and/or writing: {ACTIONS;DENIES/REPORTS:21021675::"Denies"}   No Rheumatology ROS completed.   PMFS History:  Patient Active Problem List   Diagnosis Date Noted  . MGUS (monoclonal gammopathy of unknown significance) 08/20/2016  . Osteoarthritis of both hands 07/10/2016  . DDD (degenerative disc disease), thoracic 07/10/2016  . DDD (degenerative disc disease), cervical 07/10/2016  . Plantar fasciitis, left 07/10/2016  . Anxiety 07/10/2016  . Hypertension 07/10/2016  . Chronic diarrhea 07/10/2016  . History of juvenile rheumatoid arthritis 07/10/2016    Past Medical History:  Diagnosis Date  . Ankle fracture 1999   right   . Anxiety 07/10/2016  . Chronic diarrhea 07/10/2016  . DDD (degenerative disc disease), cervical 07/10/2016  . DDD (degenerative disc disease), lumbar 07/10/2016  . DDD (degenerative disc disease), thoracic 07/10/2016  . Hypertension 07/10/2016  . Osteoarthritis of both hands 07/10/2016   Mild   . Plantar fasciitis, left 07/10/2016    No family history on file. Past Surgical History:  Procedure Laterality Date  . ANKLE FRACTURE SURGERY     Social History   Social History Narrative    . No narrative on file     Objective: Vital Signs: There were no vitals taken for this visit.   Physical Exam   Musculoskeletal Exam: ***  CDAI Exam: No CDAI exam completed.    Investigation: Findings:  August 2017:  Comprehensive metabolic panel, sed rate, CK, and TSH were normal.  Rheumatoid factor and CCP were negative.  HLA-B27 was negative.  HIV, hepatitis panel, G6PD, SPEP and tuberculosis tests were all within normal limits.  04/25/2016 I obtained an x-ray of his C-spine, 2 views in the office today, which showed C5-6 disc space narrowing with no syndesmophytes.  These findings were consistent with disc disease of the C-spine.  Thoracic spine showed mild spondylosis.  No significant disc space narrowing was noted.  No syndesmophytes were noted.  Ferguson pelvis, 1 view, showed normal SI joint without any sclerosis.    X-rays of bilateral hands, 2 views, showed minimal PIP and DIP narrowing and calcification over the right 2nd PIP.     Imaging: No results found.  Speciality Comments: No specialty comments available.    Procedures:  No procedures performed Allergies: Patient has no known allergies.   Assessment / Plan:     Visit Diagnoses: Primary osteoarthritis of both hands  DDD (degenerative disc disease), thoracic  DDD (degenerative disc disease), cervical  Plantar fasciitis, left  History of juvenile rheumatoid arthritis  MGUS (monoclonal gammopathy of unknown significance)  Chronic diarrhea    Orders: No orders of the defined types were placed in this encounter.  No orders of the defined types were placed in this encounter.   Face-to-face time spent with patient was *** minutes. 50% of  time was spent in counseling and coordination of care.  Follow-Up Instructions: No Follow-up on file.   Amy Littrell, RT  Note - This record has been created using [employer].  Chart creation errors have been sought, but may not always  have been  located. Such creation errors do not reflect on  the standard of medical care.

## 2016-12-13 ENCOUNTER — Ambulatory Visit: Payer: BLUE CROSS/BLUE SHIELD | Admitting: Rheumatology

## 2017-02-11 ENCOUNTER — Other Ambulatory Visit (HOSPITAL_BASED_OUTPATIENT_CLINIC_OR_DEPARTMENT_OTHER): Payer: 59

## 2017-02-11 DIAGNOSIS — D472 Monoclonal gammopathy: Secondary | ICD-10-CM

## 2017-02-11 LAB — CBC WITH DIFFERENTIAL/PLATELET
BASO%: 0.7 % (ref 0.0–2.0)
BASOS ABS: 0 10*3/uL (ref 0.0–0.1)
EOS%: 1.2 % (ref 0.0–7.0)
Eosinophils Absolute: 0.1 10*3/uL (ref 0.0–0.5)
HCT: 41.9 % (ref 38.4–49.9)
HEMOGLOBIN: 14.1 g/dL (ref 13.0–17.1)
LYMPH%: 19.7 % (ref 14.0–49.0)
MCH: 28.7 pg (ref 27.2–33.4)
MCHC: 33.7 g/dL (ref 32.0–36.0)
MCV: 85 fL (ref 79.3–98.0)
MONO#: 0.3 10*3/uL (ref 0.1–0.9)
MONO%: 5.1 % (ref 0.0–14.0)
NEUT#: 4.4 10*3/uL (ref 1.5–6.5)
NEUT%: 73.3 % (ref 39.0–75.0)
Platelets: 238 10*3/uL (ref 140–400)
RBC: 4.92 10*6/uL (ref 4.20–5.82)
RDW: 13.1 % (ref 11.0–14.6)
WBC: 6 10*3/uL (ref 4.0–10.3)
lymph#: 1.2 10*3/uL (ref 0.9–3.3)

## 2017-02-11 LAB — COMPREHENSIVE METABOLIC PANEL
ALBUMIN: 4.3 g/dL (ref 3.5–5.0)
ALK PHOS: 91 U/L (ref 40–150)
ALT: 22 U/L (ref 0–55)
AST: 16 U/L (ref 5–34)
Anion Gap: 8 mEq/L (ref 3–11)
BUN: 11.4 mg/dL (ref 7.0–26.0)
CHLORIDE: 106 meq/L (ref 98–109)
CO2: 28 mEq/L (ref 22–29)
Calcium: 9.3 mg/dL (ref 8.4–10.4)
Creatinine: 0.9 mg/dL (ref 0.7–1.3)
EGFR: >90 mL/min/{1.73_m2} (ref >90)
GLUCOSE: 105 mg/dL (ref 70–140)
POTASSIUM: 3.9 meq/L (ref 3.5–5.1)
SODIUM: 141 meq/L (ref 136–145)
Total Bilirubin: 0.27 mg/dL (ref 0.20–1.20)
Total Protein: 7.5 g/dL (ref 6.4–8.3)

## 2017-02-12 LAB — KAPPA/LAMBDA LIGHT CHAINS
IG KAPPA FREE LIGHT CHAIN: 11.7 mg/L (ref 3.3–19.4)
IG LAMBDA FREE LIGHT CHAIN: 16.2 mg/L (ref 5.7–26.3)
KAPPA/LAMBDA FLC RATIO: 0.72 (ref 0.26–1.65)

## 2017-02-13 LAB — MULTIPLE MYELOMA PANEL, SERUM
ALBUMIN SERPL ELPH-MCNC: 4 g/dL (ref 2.9–4.4)
ALPHA2 GLOB SERPL ELPH-MCNC: 0.6 g/dL (ref 0.4–1.0)
Albumin/Glob SerPl: 1.4 (ref 0.7–1.7)
Alpha 1: 0.2 g/dL (ref 0.0–0.4)
B-Globulin SerPl Elph-Mcnc: 1.1 g/dL (ref 0.7–1.3)
Gamma Glob SerPl Elph-Mcnc: 1.1 g/dL (ref 0.4–1.8)
Globulin, Total: 3 g/dL (ref 2.2–3.9)
IGM (IMMUNOGLOBIN M), SRM: 36 mg/dL (ref 20–172)
IgA, Qn, Serum: 160 mg/dL (ref 90–386)
M PROTEIN SERPL ELPH-MCNC: 0.5 g/dL — AB
TOTAL PROTEIN: 7 g/dL (ref 6.0–8.5)

## 2017-02-18 ENCOUNTER — Ambulatory Visit: Payer: 59 | Admitting: Hematology and Oncology

## 2017-02-18 NOTE — Assessment & Plan Note (Deleted)
MGUS: Incidentally found elevated monoclonal protein of 0.7 g IgG lambda done 04/25/2016 done by Dr.Devashwar as part of the workup for arthritis in his hands.  Blood work review 02/11/2017: Kappa:Lambda ratio 0.72, M protein 0.5 g, creatinine 0.9, calcium 9.3, hemoglobin 14.1  I discussed with the patient and her blood work looks extremely stable not improvement from previous. I recommended once a year checkups with blood work.

## 2017-09-16 ENCOUNTER — Ambulatory Visit (INDEPENDENT_AMBULATORY_CARE_PROVIDER_SITE_OTHER): Payer: BLUE CROSS/BLUE SHIELD | Admitting: Orthopaedic Surgery

## 2017-09-16 ENCOUNTER — Encounter (INDEPENDENT_AMBULATORY_CARE_PROVIDER_SITE_OTHER): Payer: Self-pay

## 2017-09-16 ENCOUNTER — Encounter (INDEPENDENT_AMBULATORY_CARE_PROVIDER_SITE_OTHER): Payer: Self-pay | Admitting: Orthopaedic Surgery

## 2017-09-16 VITALS — BP 143/98 | HR 70 | Resp 16 | Ht 71.0 in | Wt 200.0 lb

## 2017-09-16 DIAGNOSIS — M25521 Pain in right elbow: Secondary | ICD-10-CM

## 2017-09-16 NOTE — Progress Notes (Signed)
Office Visit Note   Patient: Dillon Price           Date of Birth: [birth year]           MRN: [id number] Visit Date: 09/16/2017              Requested by: No referring provider defined for this encounter. PCP: Patient, No Pcp Per   Assessment & Plan: Visit Diagnoses:  1. Pain in right elbow     Plan: Possible partial tear of distal right biceps tendon. Injury approximately a month ago. Have discussed different diagnostic possibilities and would wait another 3-4 weeks with limited activities to see if this  Improves. Would consider an MRI scan.  Orders:  No orders of the defined types were placed in this encounter.  No orders of the defined types were placed in this encounter.     Procedures: No procedures performed   Clinical Data: No additional findings.   Subjective: Chief Complaint  Patient presents with  . Right Upper Arm - Pain, Injury, Weakness    Dillon Price is a 42 y o here for Right bicep pain radiating across R shoulder x 4 weeks ago. Pt states he feel like its "on fire". Trouble sleeping, ice and ibuprofen.  1 month ago was lifting a heavy object and felt a pop the full aspect of the right dominant elbow. There is no ecchymosis or erythema. He denied any numbness or tingling. Still having some discomfort without deformity. Also was developed some pain that radiates along the inner aspect of his arm into his shoulder and chest.  HPI  Review of Systems  Constitutional: Negative for fatigue.  HENT: Negative for hearing loss.   Respiratory: Negative for apnea, chest tightness and shortness of breath.   Cardiovascular: Negative for chest pain, palpitations and leg swelling.  Gastrointestinal: Negative for blood in stool, constipation and diarrhea.  Genitourinary: Negative for difficulty urinating.  Musculoskeletal: Positive for neck pain and neck stiffness. Negative for arthralgias, back pain, joint swelling and myalgias.  Neurological: Negative for weakness,  numbness and headaches.  Hematological: Does not bruise/bleed easily.  Psychiatric/Behavioral: Negative for sleep disturbance. The patient is not nervous/anxious.      Objective: Vital Signs: BP (!) 143/98   Pulse 70   Resp 16   Ht 5\' 11"  (1.803 m)   Wt 200 lb (90.7 kg)   BMI 27.89 kg/m   Physical Exam  Ortho Exam awake alert and oriented 3. Comfortable sitting. Biceps and biceps tendon distally are intact. No erythema or ecchymosis. No numbness or tingling distally. Good grip and good release. No instability of the elbow. No pain along the medial lateral epicondyles. Does have some tenderness along the distal biceps tendon but without evidence of obvious rupture. This could be a partial tear of its insertion on the radial tuberosity. Biceps muscle had good strength. No pain with range of motion of the right shoulder or cervical spine. No triceps pain. No pain about the chest wall  Specialty Comments:  No specialty comments available.  Imaging: No results found.   PMFS History: Patient Active Problem List   Diagnosis Date Noted  . MGUS (monoclonal gammopathy of unknown significance) 08/20/2016  . Osteoarthritis of both hands 07/10/2016  . DDD (degenerative disc disease), thoracic 07/10/2016  . DDD (degenerative disc disease), cervical 07/10/2016  . Plantar fasciitis, left 07/10/2016  . Anxiety 07/10/2016  . Hypertension 07/10/2016  . Chronic diarrhea 07/10/2016  . History of juvenile rheumatoid arthritis 07/10/2016  Past Medical History:  Diagnosis Date  . Ankle fracture 1999   right   . Anxiety 07/10/2016  . Chronic diarrhea 07/10/2016  . DDD (degenerative disc disease), cervical 07/10/2016  . DDD (degenerative disc disease), lumbar 07/10/2016  . DDD (degenerative disc disease), thoracic 07/10/2016  . Hypertension 07/10/2016  . Osteoarthritis of both hands 07/10/2016   Mild   . Plantar fasciitis, left 07/10/2016    History reviewed. No pertinent family history.   Past Surgical History:  Procedure Laterality Date  . ANKLE FRACTURE SURGERY     Social History   Occupational History  . Not on file  Tobacco Use  . Smoking status: Former Research scientist (life sciences)  . Smokeless tobacco: Never Used  Substance and Sexual Activity  . Alcohol use: No  . Drug use: Not on file  . Sexual activity: Not on file

## 2020-08-02 ENCOUNTER — Emergency Department (HOSPITAL_COMMUNITY): Payer: 59

## 2020-08-02 ENCOUNTER — Emergency Department (HOSPITAL_COMMUNITY)
Admission: EM | Admit: 2020-08-02 | Discharge: 2020-08-02 | Disposition: A | Discharge status: Home / Self Care | Payer: 59 | Attending: Emergency Medicine | Admitting: Emergency Medicine

## 2020-08-02 ENCOUNTER — Encounter (HOSPITAL_COMMUNITY): Payer: Self-pay

## 2020-08-02 ENCOUNTER — Other Ambulatory Visit: Payer: Self-pay

## 2020-08-02 DIAGNOSIS — I10 Essential (primary) hypertension: Secondary | ICD-10-CM | POA: Diagnosis not present

## 2020-08-02 DIAGNOSIS — Z87891 Personal history of nicotine dependence: Secondary | ICD-10-CM | POA: Insufficient documentation

## 2020-08-02 DIAGNOSIS — Z79899 Other long term (current) drug therapy: Secondary | ICD-10-CM | POA: Insufficient documentation

## 2020-08-02 DIAGNOSIS — U071 COVID-19: Secondary | ICD-10-CM | POA: Insufficient documentation

## 2020-08-02 DIAGNOSIS — R059 Cough, unspecified: Secondary | ICD-10-CM | POA: Diagnosis present

## 2020-08-02 LAB — BASIC METABOLIC PANEL
Anion gap: 11 (ref 5–15)
BUN: 9 mg/dL (ref 6–20)
CO2: 26 mmol/L (ref 22–32)
Calcium: 8.7 mg/dL — ABNORMAL LOW (ref 8.9–10.3)
Chloride: 101 mmol/L (ref 98–111)
Creatinine, Ser: 0.99 mg/dL (ref 0.61–1.24)
GFR, Estimated: >60 mL/min (ref >60)
Glucose, Bld: 109 mg/dL — ABNORMAL HIGH (ref 70–99)
Potassium: 3.4 mmol/L — ABNORMAL LOW (ref 3.5–5.1)
Sodium: 138 mmol/L (ref 135–145)

## 2020-08-02 LAB — CBC
HCT: 47 % (ref 39.0–52.0)
Hemoglobin: 16 g/dL (ref 13.0–17.0)
MCH: 29.6 pg (ref 26.0–34.0)
MCHC: 34 g/dL (ref 30.0–36.0)
MCV: 86.9 fL (ref 80.0–100.0)
Platelets: 211 10*3/uL (ref 150–400)
RBC: 5.41 MIL/uL (ref 4.22–5.81)
RDW: 12.4 % (ref 11.5–15.5)
WBC: 4.4 10*3/uL (ref 4.0–10.5)
nRBC: 0 % (ref 0.0–0.2)

## 2020-08-02 MED ORDER — EPINEPHRINE 0.3 MG/0.3ML IJ SOAJ: 0.3000 mg | Freq: Once | INTRAMUSCULAR | Status: DC | PRN

## 2020-08-02 MED ORDER — METHYLPREDNISOLONE SODIUM SUCC 125 MG IJ SOLR: 125.0000 mg | Freq: Once | INTRAMUSCULAR | Status: DC | PRN

## 2020-08-02 MED ORDER — ALBUTEROL SULFATE HFA 108 (90 BASE) MCG/ACT IN AERS: 2.0000 | INHALATION_SPRAY | Freq: Once | RESPIRATORY_TRACT | Status: DC | PRN

## 2020-08-02 MED ORDER — SODIUM CHLORIDE 0.9 % IV SOLN
Freq: Once | INTRAVENOUS | Status: AC
  Filled 2020-08-02: qty 20

## 2020-08-02 MED ORDER — DIPHENHYDRAMINE HCL 50 MG/ML IJ SOLN: 50.0000 mg | Freq: Once | INTRAMUSCULAR | Status: DC | PRN

## 2020-08-02 MED ORDER — BENZONATATE 100 MG PO CAPS
100.0000 mg | ORAL_CAPSULE | Freq: Once | ORAL | Status: AC
  Administered 2020-08-02: 100 mg via ORAL
  Filled 2020-08-02: qty 1

## 2020-08-02 MED ORDER — SODIUM CHLORIDE 0.9 % IV SOLN: INTRAVENOUS | Status: DC | PRN

## 2020-08-02 MED ORDER — FAMOTIDINE IN NACL 20-0.9 MG/50ML-% IV SOLN: 20.0000 mg | Freq: Once | INTRAVENOUS | Status: DC | PRN

## 2020-08-02 MED ORDER — BENZONATATE 100 MG PO CAPS: 100.0000 mg | ORAL_CAPSULE | Freq: Three times a day (TID) | ORAL | 0 refills | Status: DC

## 2020-08-02 MED ORDER — SODIUM CHLORIDE 0.9 % IV SOLN: 1200.0000 mg | Freq: Once | INTRAVENOUS | Status: DC

## 2020-08-02 NOTE — ED Notes (Signed)
Patient verbalizes understanding of discharge instructions. Opportunity for questioning and answers were provided. Armband removed by staff, pt discharged from ED ambulatory to home.  

## 2020-08-02 NOTE — Discharge Instructions (Signed)
Your chest x-ray today shows that you have pneumonia likely as a result of the Covid infection.  You have received the monoclonal antibody medication for Covid to help reduce the duration of your symptoms.   Continue to quarantine at home until you are feeling better and at least 48 hours without fever and without taking medications to reduce your fever.  Take cough medicine as needed. You may want to purchase a portable pulse oximeter to monitor your oxygen levels at home.  Return to the emergency department if your oxygen level drops below 90%.

## 2020-08-02 NOTE — ED Notes (Signed)
This RN called the [facility] regarding this pts infusion. Preparation is still being made but infusion should be sent soon.

## 2020-08-02 NOTE — ED Triage Notes (Signed)
Pt tested positive for COVID about 1 week ago and is having worsening SOB and cough. States he laid on the bathroom floor for 20 mins today just coughing until he vomited. resp e.u, 95% on room air.

## 2020-08-02 NOTE — ED Provider Notes (Signed)
[facility] EMERGENCY DEPARTMENT Provider Note   CSN: [id number] Arrival date & time: 08/02/20  1443     History Chief Complaint  Patient presents with  . Covid Positive  . Shortness of Breath    Dillon Price is a 44 y.o. male.  HPI 44 year old male with history of degenerative disc disease, hypertension, OSA presents to the ER with complaints of 1 week of Covid symptoms.  Tested positive about 8 days ago at an urgent care.  His main complaint is a cough that keeps him up at night, causes him to have coughing fits.  States he had a coughing fit earlier today that lasted about 30 minutes and ended in a posttussive emesis.  Denies any blood in his cough or vomitus.  Denies any chest pain or shortness of breath.  No abdominal pain.  He is not vaccinated.  He is not vaccinated. States he took over the counter cough medicine with no relief. Requesting MAB infusion     Past Medical History:  Diagnosis Date  . Ankle fracture 1999   right   . Anxiety 07/10/2016  . Chronic diarrhea 07/10/2016  . DDD (degenerative disc disease), cervical 07/10/2016  . DDD (degenerative disc disease), lumbar 07/10/2016  . DDD (degenerative disc disease), thoracic 07/10/2016  . Hypertension 07/10/2016  . Osteoarthritis of both hands 07/10/2016   Mild   . Plantar fasciitis, left 07/10/2016    Patient Active Problem List   Diagnosis Date Noted  . MGUS (monoclonal gammopathy of unknown significance) 08/20/2016  . Osteoarthritis of both hands 07/10/2016  . DDD (degenerative disc disease), thoracic 07/10/2016  . DDD (degenerative disc disease), cervical 07/10/2016  . Plantar fasciitis, left 07/10/2016  . Anxiety 07/10/2016  . Hypertension 07/10/2016  . Chronic diarrhea 07/10/2016  . History of juvenile rheumatoid arthritis 07/10/2016    Past Surgical History:  Procedure Laterality Date  . ANKLE FRACTURE SURGERY         No family history on file.  Social History   Tobacco  Use  . Smoking status: Former Research scientist (life sciences)  . Smokeless tobacco: Never Used  Substance Use Topics  . Alcohol use: No  . Drug use: Not on file    Home Medications Prior to Admission medications   Medication Sig Start Date End Date Taking? Authorizing Provider  acetaminophen (TYLENOL) 500 MG tablet Take 500 mg by mouth every 6 (six) hours as needed.   Yes [provider]  ALPRAZolam Duanne Moron) 0.5 MG tablet Take 0.5-1 mg by mouth 4 (four) times daily as needed for anxiety.  12/07/15  Yes [provider]  ibuprofen (ADVIL,MOTRIN) 200 MG tablet Take 400 mg by mouth every 6 (six) hours as needed for moderate pain.    Yes [provider]  lamoTRIgine (LAMICTAL) 200 MG tablet Take 400 mg by mouth daily.  02/05/16  Yes [provider]  verapamil (CALAN-SR) 120 MG CR tablet Take 120 mg by mouth daily.  06/14/16  Yes [provider]  benzonatate (TESSALON) 100 MG capsule Take 1 capsule (100 mg total) by mouth every 8 (eight) hours. 08/02/20   Garald Balding, PA-C    Allergies    Patient has no known allergies.  Review of Systems   Review of Systems  Constitutional: Negative for chills and fever.  Respiratory: Positive for cough. Negative for shortness of breath.   Cardiovascular: Negative for chest pain.  Gastrointestinal: Positive for vomiting. Negative for abdominal pain and nausea.    Physical Exam  Updated Vital Signs BP (!) 142/98 (BP Location: Right Arm)   Pulse 84   Temp 99.4 F (37.4 C) (Oral)   Resp 20   Ht 6\' 1"  (1.854 m)   Wt 94.3 kg   SpO2 96%   BMI 27.44 kg/m   Physical Exam Vitals and nursing note reviewed.  Constitutional:      Appearance: He is well-developed.  HENT:     Head: Normocephalic and atraumatic.  Eyes:     Conjunctiva/sclera: Conjunctivae normal.  Cardiovascular:     Rate and Rhythm: Normal rate and regular rhythm.     Heart sounds: No murmur heard.   Pulmonary:     Effort: Pulmonary effort is normal. No  respiratory distress.     Breath sounds: Normal breath sounds.     Comments: Respirations equal and unlabored, patient able to speak in full sentences, lungs clear to auscultation bilaterally, occasional cough with inspiration   Abdominal:     Palpations: Abdomen is soft.     Tenderness: There is no abdominal tenderness.  Musculoskeletal:     Cervical back: Neck supple.  Skin:    General: Skin is warm and dry.  Neurological:     Mental Status: He is alert.     ED Results / Procedures / Treatments   Labs (all labs ordered are listed, but only abnormal results are displayed) Labs Reviewed  BASIC METABOLIC PANEL - Abnormal; Notable for the following components:      Result Value   Potassium 3.4 (*)    Glucose, Bld 109 (*)    Calcium 8.7 (*)    All other components within normal limits  CBC    EKG EKG Interpretation  Date/Time:  Tuesday August 02 2020 14:48:49 EST Ventricular Rate:  102 PR Interval:  146 QRS Duration: 84 QT Interval:  342 QTC Calculation: 445 R Axis:   28 Text Interpretation: Sinus tachycardia Otherwise normal ECG No previous ECGs available Confirmed by Gareth Morgan [phone]) on 08/02/2020 6:08:30 PM   Radiology DG Chest Portable 1 View  Result Date: 08/02/2020 CLINICAL DATA:  Cough, shortness of breath and chest pain. COVID positive. EXAM: PORTABLE CHEST 1 VIEW COMPARISON:  None. FINDINGS: Very mild atelectasis and/or early infiltrate is seen within the left lung base. There is no evidence of a pleural effusion or pneumothorax. The heart size and mediastinal contours are within normal limits. The visualized skeletal structures are unremarkable. IMPRESSION: Very mild left basilar atelectasis and/or early infiltrate. Electronically Signed   By: Virgina Norfolk M.D.   On: 08/02/2020 15:36    Procedures Procedures (including critical care time)  Medications Ordered in ED Medications  0.9 %  sodium chloride infusion (has no administration in time  range)  diphenhydrAMINE (BENADRYL) injection 50 mg (has no administration in time range)  famotidine (PEPCID) IVPB 20 mg premix (has no administration in time range)  methylPREDNISolone sodium succinate (SOLU-MEDROL) 125 mg/2 mL injection 125 mg (has no administration in time range)  albuterol (VENTOLIN HFA) 108 (90 Base) MCG/ACT inhaler 2 puff (has no administration in time range)  EPINEPHrine (EPI-PEN) injection 0.3 mg (has no administration in time range)  bamlanivimab 700 mg, etesevimab 1,400 mg in sodium chloride 0.9 % 160 mL IVPB ( Intravenous Stopped 08/02/20 2148)  benzonatate (TESSALON) capsule 100 mg (100 mg Oral Given 08/02/20 2150)    ED Course  I have reviewed the triage vital signs and the nursing notes.  Pertinent labs & imaging results that were available during my care of  the patient were reviewed by me and considered in my medical decision making (see chart for details).    MDM Rules/Calculators/A&P                          44 year old male presents with cough in the setting of COVID-19. Vitals on arrival overall reassuring, he is not hypoxic or tachypneic.  Ambulated in the room and O2 stayed above 94%.  Chest x-ray with possible early evidence of infiltrates, likely viral pneumonia.  CBC and BMP unremarkable.  Patient is on day eight of his illness, with the holidays coming up, ordered MAB infusion here in the ED.  Tessalon Perles given for cough.  Patient was observed here in the ED for an hour with no complications/reactions.  Will send home with [facility].  Encouraged PCP follow-up.  Return precautions discussed.  He voiced understanding and is agreeable.  At this stage in the ED course, the patient has been medically screened and stable for discharge.  Final Clinical Impression(s) / ED Diagnoses Final diagnoses:  Cough  COVID-19    Rx / DC Orders ED Discharge Orders         Ordered    benzonatate (TESSALON) 100 MG capsule  Every 8 hours        08/02/20 2140            Lyndel Safe 08/02/20 2257    Gareth Morgan, MD 08/03/20 1051

## 2020-08-02 NOTE — ED Notes (Signed)
Pt SpO2 stayed above 94% while ambulating.

## 2020-08-03 ENCOUNTER — Telehealth: Payer: Self-pay

## 2020-08-03 NOTE — Telephone Encounter (Signed)
Pt. Asking if the infusion therapy decreases the time you need to quarantine. Instructed he still needs to quarantine 10 days from beginning of symptoms. Verbalizes understanding.

## 2021-05-22 IMAGING — CR DG CHEST 1V PORT
1 series · 1 of 1 positions shown · non-contrast
Comparison: None.

CLINICAL DATA: Cough, shortness of breath and chest pain. COVID
positive.

EXAM:
PORTABLE CHEST 1 VIEW

[AP]
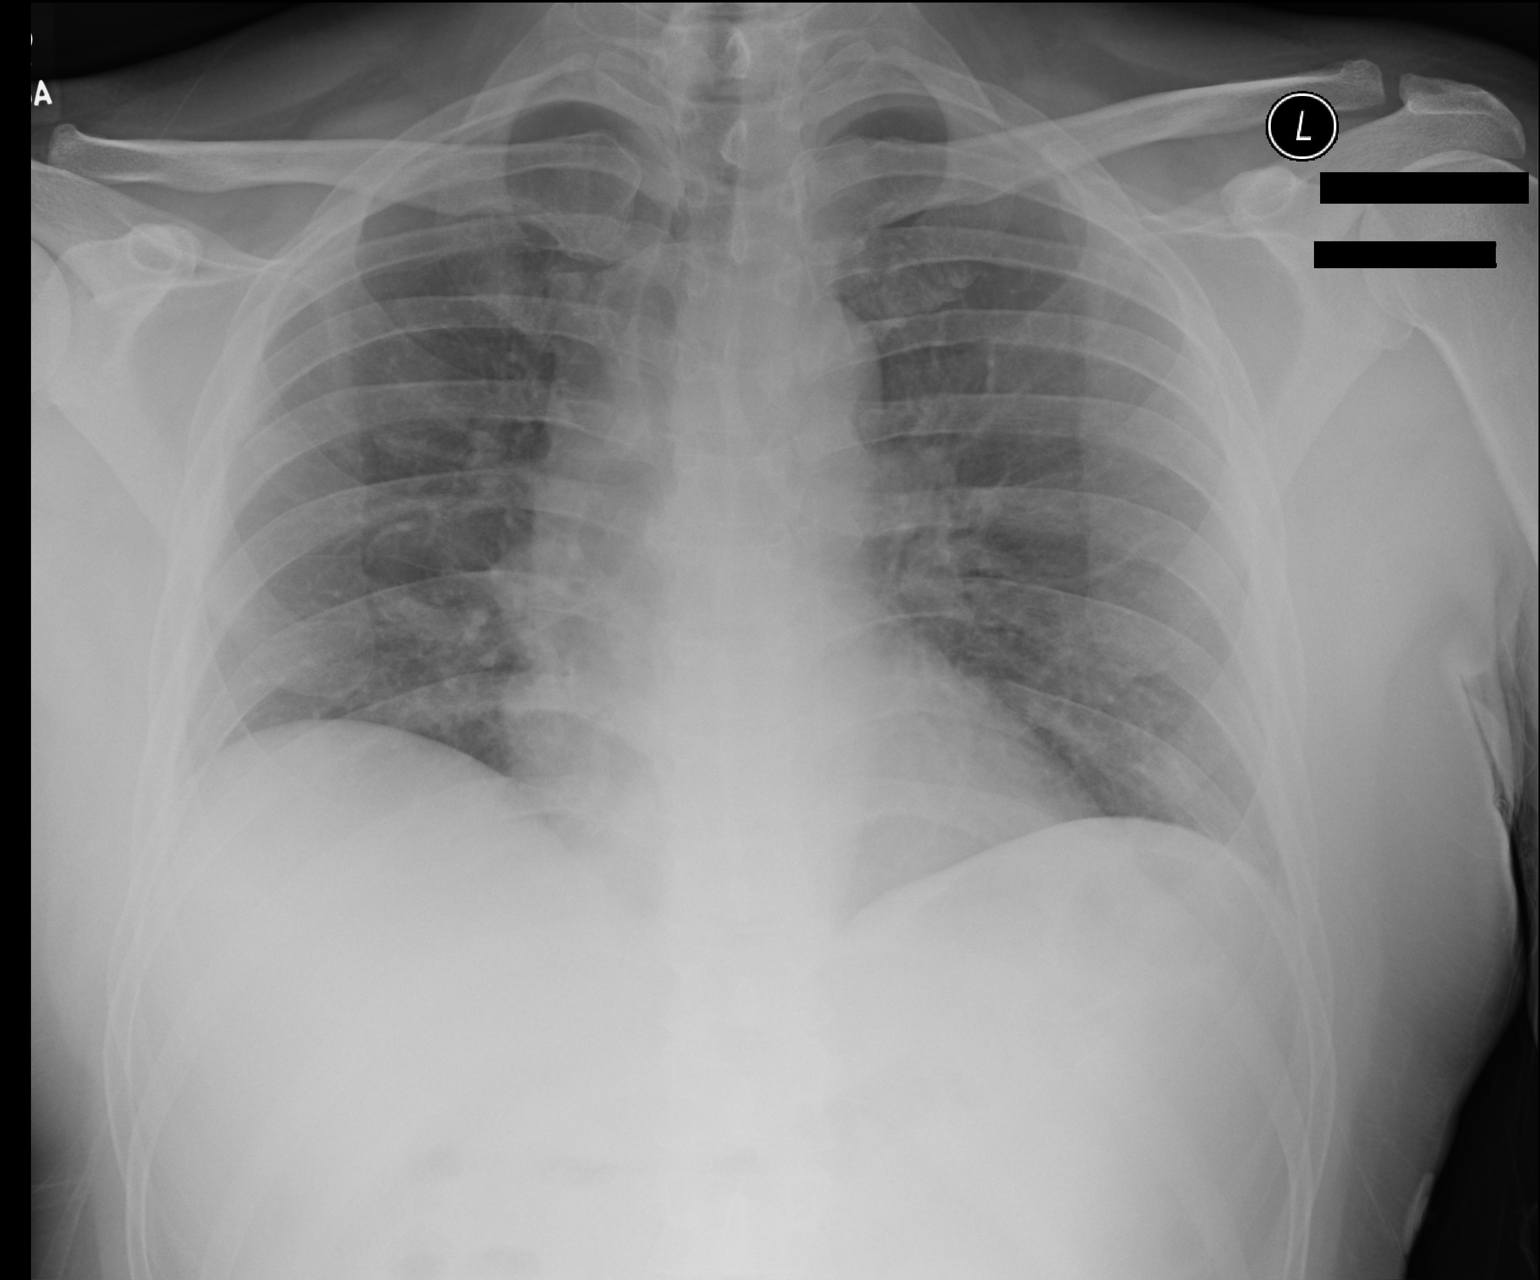

[1 of 1 positions shown; findings below may reference images not displayed]

FINDINGS: Very mild atelectasis and/or early infiltrate is seen within the
left lung base. There is no evidence of a pleural effusion or
pneumothorax. The heart size and mediastinal contours are within
normal limits. The visualized skeletal structures are unremarkable.
IMPRESSION: Very mild left basilar atelectasis and/or early infiltrate.

## 2022-03-15 ENCOUNTER — Encounter: Payer: Self-pay | Admitting: Behavioral Health

## 2022-03-15 ENCOUNTER — Ambulatory Visit (INDEPENDENT_AMBULATORY_CARE_PROVIDER_SITE_OTHER): Payer: 59 | Admitting: Behavioral Health

## 2022-03-15 VITALS — BP 165/96 | HR 63 | Ht 73.0 in | Wt 208.0 lb

## 2022-03-15 DIAGNOSIS — F411 Generalized anxiety disorder: Secondary | ICD-10-CM | POA: Diagnosis not present

## 2022-03-15 DIAGNOSIS — F331 Major depressive disorder, recurrent, moderate: Secondary | ICD-10-CM | POA: Diagnosis not present

## 2022-03-15 MED ORDER — VORTIOXETINE HBR 10 MG PO TABS: 10.0000 mg | ORAL_TABLET | Freq: Every day | ORAL | 1 refills | Status: DC

## 2022-03-15 NOTE — Progress Notes (Signed)
[facility] MD/PA/NP Initial Note  03/15/2022 5:25 PM Dillon Price  MRN:  [id number]  Chief Complaint:  Chief Complaint   Anxiety; Depression; Follow-up; Medication Problem     HPI:   "Dillon Price", 46 year old male presents to this office for initial visit and to establish care. He was previously under the care of Dillon Price. He says,"I have been pissed off for 30 year". Says that he doesn't want to focus on "Daddy Issues", but he has suffered over the years with a lot of resent and depression. He preceded to show me video of his Dad being shot and killed in police altercation occurring in 2017. Says father had serious mental issues and had been hospitalized multiple times. He father was diagnosed with Bipolar but does not want to thing he may have bipolar also. He was diagnosed in his early 20's with Bipolar but does not agree. Under the care of Dr. Toy Care, he says that he has tried about every psychiatric medication there is. He said he would give the medication a "fair shot" and would take for at least 8 weeks before stopping. His child hood was full of instability and trauma but he consider his faith to be important part of him quitting alcohol and drugs years ago. 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 He says that he just continue to feel depressed with no joy in life. Even thought he has been able to function and provide for his family, he continues to have high levels of anxiety and depression. He says he is open to trying another medication again. He says his anxiety today is 9/10 and depression is 6/10. He denies any current mania and cannot remember the last time occurred. He says that he sleeps 7-8 hours per night. His MDQ was mostly positive endorsing periods of irritability, hyperactivity,talkativeness, racing thoughts, and high energy occurring simultaneously.  He considers these criterion a serious problem.  Denies psychosis, no hx of auditory or visual hallucinations. No  SI/HI.   Previous psychiatric medication trials:  Wellbutrin Zolft Prozac Klonopin Lithium Tegretol Celexa Latuda Lexapro Paxil Luvox Effexor Pristiq Abilify Seroquel Zyprexa Risperidone Depakote Lamictal       Visit Diagnosis:    ICD-10-CM   1. Major depressive disorder, recurrent episode, moderate (HCC)  F33.1 vortioxetine HBr (TRINTELLIX) 10 MG TABS tablet    2. Generalized anxiety disorder  F41.1 vortioxetine HBr (TRINTELLIX) 10 MG TABS tablet      Past Psychiatric History: Anxiety, MDD, self report of previous diagnosis of Bipolar in early 20's. Could not collaborate in medical chart.  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   Hypertension 07/10/2016   Osteoarthritis of both hands 07/10/2016   Mild    Plantar fasciitis, left 07/10/2016    Past Surgical History:  Procedure Laterality Date   ANKLE FRACTURE SURGERY      Family Psychiatric History: see chart  Family History:  Family History  Problem Relation Age of Onset   Depression Mother    Bipolar disorder Father    Suicidality Father    Depression Paternal Uncle    Anxiety disorder Paternal Uncle    Suicidality Paternal Grandfather     Social History:  Social History  Socioeconomic History   Marital status: Married    Spouse name: Not on file   Number of children: 5   Years of education: 12   Highest education level: High school graduate  Occupational History   Occupation: Catering manager for [employer]  Tobacco Use   Smoking status: Former   Smokeless tobacco: Never  Substance and Sexual Activity   Alcohol use: No   Drug use: Not Currently   Sexual activity: Yes  Other Topics Concern    Not on file  Social History Narrative   Lives in [place] with wife. Has 4 children from current wife and one child he later discovered as adult. He acknowledges his Darrick Meigs faith as a big part of his life. Goes to church 3 days per week.    Social Determinants of Health   Financial Resource Strain: Not on file  Food Insecurity: Not on file  Transportation Needs: Not on file  Physical Activity: Not on file  Stress: Not on file  Social Connections: Not on file    Allergies: No Known Allergies  Metabolic Disorder Labs: No results found for: "HGBA1C", "MPG" No results found for: "PROLACTIN" No results found for: "CHOL", "TRIG", "HDL", "CHOLHDL", "VLDL", "LDLCALC" No results found for: "TSH"  Therapeutic Level Labs: No results found for: "LITHIUM" No results found for: "VALPROATE" No results found for: "CBMZ"  Current Medications: Current Outpatient Medications  Medication Sig Dispense Refill   vortioxetine HBr (TRINTELLIX) 10 MG TABS tablet Take 1 tablet (10 mg total) by mouth daily. 30 tablet 1   acetaminophen (TYLENOL) 500 MG tablet Take 500 mg by mouth every 6 (six) hours as needed.     ALPRAZolam (XANAX) 0.5 MG tablet Take 0.5-1 mg by mouth 4 (four) times daily as needed for anxiety.      benzonatate (TESSALON) 100 MG capsule Take 1 capsule (100 mg total) by mouth every 8 (eight) hours. 21 capsule 0   ibuprofen (ADVIL,MOTRIN) 200 MG tablet Take 400 mg by mouth every 6 (six) hours as needed for moderate pain.      lamoTRIgine (LAMICTAL) 200 MG tablet Take 400 mg by mouth daily.      verapamil (CALAN-SR) 120 MG CR tablet Take 120 mg by mouth daily.   11   No current facility-administered medications for this visit.    Medication Side Effects: none  Orders placed this visit:  No orders of the defined types were placed in this encounter.   Psychiatric Specialty Exam:  Review of Systems  HENT:  Positive for sinus pressure.   Eyes:  Positive for pain and discharge.   Cardiovascular:  Positive for chest pain and palpitations.  Musculoskeletal:  Positive for back pain, joint swelling, myalgias and neck pain.  Allergic/Immunologic: Positive for environmental allergies.  Neurological:  Positive for headaches.  Hematological:  Bruises/bleeds easily.    There were no vitals taken for this visit.There is no height or weight on file to calculate BMI.  General Appearance: Casual, Neat, and Well Groomed  Eye Contact:  Good  Speech:  Clear and Coherent  Volume:  Normal  Mood:  NA  Affect:  Appropriate  Thought Process:  Coherent  Orientation:  Full (Time, Place, and Person)  Thought Content: Logical   Suicidal Thoughts:  No  Homicidal Thoughts:  No  Memory:  WNL  Judgement:  Good  Insight:  Good  Psychomotor Activity:  Normal  Concentration:  Concentration: Good  Recall:  Good  Fund of Knowledge: Good  Language:  Good  Assets:  Desire for Improvement  ADL's:  Intact  Cognition: WNL  Prognosis:  Good   Screenings:  PHQ2-9    Flowsheet Row Office Visit from 03/15/2022 in [facility]  PHQ-2 Total Score 4  PHQ-9 Total Score 11         Receiving Psychotherapy: No   Treatment Plan/Recommendations:   Greater than 50% of 60 min face to face time with patient was spent on counseling and coordination of care. We discussed his long history of anxiety, depression, and mood disorder. We discussed the his past diagnosis of Bipolar which he does not necessarily agree with but I explain the high probability due to strong family hx of mental disorder combined with his own past behaviors. I discussed his very lengthy list of medication trials and was candid with him about doubts that medication were working for him. I explain that I was will to try a few more medications with him to see if we can get a response but I would advise him if I felt he needed a higher level of care. He agrees that he has given previous medications an adequate amount  of time before quitting. We discussed his long term benzo use and I explained that I would not be increasing benzo dose.  We agreed to: Continue Lamictal 400 mg daily Continue Xanax 1 mg daily for severe anxiety To start Trintellix 10 mg daily  Will report side effects or worsening symtoms To follow up in 4 weeks to reassess Provided emergency contact information Discussed potential benefits, risk, and side effects of benzodiazepines to include potential risk of tolerance and dependence, as well as possible drowsiness.  Advised patient not to drive if experiencing drowsiness and to take lowest possible effective dose to minimize risk of dependence and tolerance.  Reviewed PDMP   Elwanda Brooklyn, NP

## 2022-03-19 ENCOUNTER — Telehealth: Payer: Self-pay

## 2022-03-19 NOTE — Telephone Encounter (Signed)
Prior Authorization submitted for TRINTELLIX 10 MG TABS with [facility] ID# [id number], pending response.

## 2022-03-19 NOTE — Telephone Encounter (Signed)
Prior Approval received effective 07/10/202-03/20/2023 for TRINTELLIX 10 MG  His pharmacy is aware.

## 2022-03-30 ENCOUNTER — Telehealth: Payer: Self-pay | Admitting: Behavioral Health

## 2022-03-30 NOTE — Telephone Encounter (Signed)
Patient LVM at 4:44 stating that message was for BW about regulating meds and anxiety. Ph: [phone]

## 2022-04-02 ENCOUNTER — Other Ambulatory Visit: Payer: Self-pay

## 2022-04-02 ENCOUNTER — Other Ambulatory Visit: Payer: Self-pay | Admitting: Behavioral Health

## 2022-04-02 DIAGNOSIS — F411 Generalized anxiety disorder: Secondary | ICD-10-CM

## 2022-04-02 DIAGNOSIS — F331 Major depressive disorder, recurrent, moderate: Secondary | ICD-10-CM

## 2022-04-02 MED ORDER — VORTIOXETINE HBR 20 MG PO TABS: 20.0000 mg | ORAL_TABLET | Freq: Every day | ORAL | 1 refills | Status: DC

## 2022-04-02 MED ORDER — ALPRAZOLAM 1 MG PO TABS: 1.0000 mg | ORAL_TABLET | Freq: Every day | ORAL | 0 refills | Status: DC | PRN

## 2022-04-02 NOTE — Telephone Encounter (Signed)
Please do not waste Trintellix 10 mg. I sent new script for 20 mg tablet to [facility]. It should not be three hundred dollars if they are running discount card correctly. Please inquire about this. I approved lorazepam but will not increase at this time.

## 2022-04-02 NOTE — Telephone Encounter (Signed)
Patient called about the Trintellix. He said his anger has improved but his anxiety is worse. He said the medication was around $300 and he wants to know if he should increase the dose. He is due for a refill and doesn't want to get it if he needs to increase dose. He does have a co-pay card and received a PA approval.   He is also asking for a RF of lorazepam.  He last filled 4/19. I will pend a refill separately.   Filled  Written  ID  Drug  QTY  Days  Prescriber  RX #  Dispenser  Refill  Daily Dose*  Pymt Type  PMP  12/27/2021 12/27/2021 1  Alprazolam 1 Mg Tablet 120.00 30 [place]

## 2022-04-02 NOTE — Telephone Encounter (Signed)
The alprazolam went to one pharmacy and the Trintellix went to another pharmacy.  Canceled Rx at [facility] and sent to [facility], so that both medications were at the same pharmacy.

## 2022-04-03 ENCOUNTER — Telehealth: Payer: Self-pay | Admitting: Behavioral Health

## 2022-04-03 NOTE — Telephone Encounter (Signed)
Pt LVM 7/24 @ 8:19p.  He said the med [place] prescribed for his is $515 for 1 month.  With discount card etc, it will still be $219, which is "too rich for my blood".  He wants to know what he can do.  Next appt 8/3

## 2022-04-06 NOTE — Telephone Encounter (Signed)
Patient is saying even with co-pay card and insurance it is too expensive. He has some samples left and has an appt with Aaron Edelman next week. He will get more samples at his appt and he can discuss cost with Aaron Edelman as well. Patient said he does feel like medication is helping his mood.

## 2022-04-12 ENCOUNTER — Ambulatory Visit (INDEPENDENT_AMBULATORY_CARE_PROVIDER_SITE_OTHER): Payer: 59 | Admitting: Behavioral Health

## 2022-04-12 ENCOUNTER — Encounter: Payer: Self-pay | Admitting: Behavioral Health

## 2022-04-12 DIAGNOSIS — F411 Generalized anxiety disorder: Secondary | ICD-10-CM

## 2022-04-12 DIAGNOSIS — F331 Major depressive disorder, recurrent, moderate: Secondary | ICD-10-CM | POA: Diagnosis not present

## 2022-04-12 MED ORDER — LAMOTRIGINE 200 MG PO TABS: 400.0000 mg | ORAL_TABLET | Freq: Every day | ORAL | 1 refills | Status: DC

## 2022-04-12 MED ORDER — VILAZODONE HCL 20 MG PO TABS: 20.0000 mg | ORAL_TABLET | Freq: Every day | ORAL | 1 refills | Status: DC

## 2022-04-12 MED ORDER — ALPRAZOLAM 1 MG PO TABS: 1.0000 mg | ORAL_TABLET | Freq: Every day | ORAL | 0 refills | Status: DC | PRN

## 2022-04-12 NOTE — Progress Notes (Signed)
[facility] Med Check  Patient ID: Dillon Price,  MRN: [id number]  PCP: Patient, No Pcp Per  Date of Evaluation: 04/12/2022 Time spent:30 minutes  Chief Complaint:  Chief Complaint   Depression; Anxiety; Medication Problem; Medication Refill; Follow-up; Patient Education     HISTORY/CURRENT STATUS: HPI   "Dillon Price", 46 year old male presents to this office for follow up and medication management. He has been very happy with Trintellix and wanted to consider increasing dosage but he says that he can not afford. After insurance and discount card medication was nearly 200 dollars per month.  Now, he requesting trial of another medication. He says his anxiety today is 4/10 and depression is 3/10. He denies any current mania and cannot remember the last time occurred. He says that he sleeps 7-8 hours per night. Denies psychosis, no hx of auditory or visual hallucinations. No SI/HI.    Previous psychiatric medication trials:   Wellbutrin Zolft Prozac Klonopin Lithium Tegretol Celexa Latuda Lexapro Paxil Luvox Effexor Pristiq Abilify Seroquel Zyprexa Risperidone Depakote Lamictal    Individual Medical History/ Review of Systems: Changes? :No   Allergies: Patient has no known allergies.  Current Medications:  Current Outpatient Medications:    Vilazodone HCl 20 MG TABS, Take 1 tablet (20 mg total) by mouth daily., Disp: 30 tablet, Rfl: 1   acetaminophen (TYLENOL) 500 MG tablet, Take 500 mg by mouth every 6 (six) hours as needed., Disp: , Rfl:    ALPRAZolam (XANAX) 1 MG tablet, Take 1 tablet (1 mg total) by mouth daily as needed for anxiety., Disp: 90 tablet, Rfl: 0   benzonatate (TESSALON) 100 MG capsule, Take 1 capsule (100 mg total) by mouth every 8 (eight) hours., Disp: 21 capsule, Rfl: 0   ibuprofen (ADVIL,MOTRIN) 200 MG tablet, Take 400 mg by mouth every 6 (six) hours as needed for moderate pain. , Disp: , Rfl:    lamoTRIgine (LAMICTAL) 200 MG tablet, Take 2 tablets  (400 mg total) by mouth daily., Disp: 180 tablet, Rfl: 1   verapamil (CALAN-SR) 120 MG CR tablet, Take 120 mg by mouth daily. , Disp: , Rfl: 11   vortioxetine HBr (TRINTELLIX) 10 MG TABS tablet, Take 1 tablet (10 mg total) by mouth daily., Disp: 30 tablet, Rfl: 1   vortioxetine HBr (TRINTELLIX) 20 MG TABS tablet, Take 1 tablet (20 mg total) by mouth daily., Disp: 30 tablet, Rfl: 1 Medication Side Effects: none  Family Medical/ Social History: Changes? No  MENTAL HEALTH EXAM:  There were no vitals taken for this visit.There is no height or weight on file to calculate BMI.  General Appearance: Casual, Neat, and Well Groomed  Eye Contact:  Good  Speech:  Clear and Coherent  Volume:  Normal  Mood:  Anxious  Affect:  Appropriate  Thought Process:  Coherent  Orientation:  Full (Time, Place, and Person)  Thought Content: Logical   Suicidal Thoughts:  No  Homicidal Thoughts:  No  Memory:  WNL  Judgement:  Good  Insight:  Good  Psychomotor Activity:  Normal  Concentration:  Concentration: Good  Recall:  Good  Fund of Knowledge: Good  Language: Good  Assets:  Desire for Improvement  ADL's:  Intact  Cognition: WNL  Prognosis:  Good    DIAGNOSES:    ICD-10-CM   1. Generalized anxiety disorder  F41.1 Vilazodone HCl 20 MG TABS    ALPRAZolam (XANAX) 1 MG tablet    lamoTRIgine (LAMICTAL) 200 MG tablet    2. Major depressive disorder, recurrent episode,  moderate (HCC)  F33.1 Vilazodone HCl 20 MG TABS    lamoTRIgine (LAMICTAL) 200 MG tablet      Receiving Psychotherapy: No    RECOMMENDATIONS:   Greater than 50% of 30 min face to face time with patient was spent on counseling and coordination of care. We talked about his moderate improvement with anxiety and depression. Says his anxiety level is not where he would like but has been diligent in not taking more than 1 mg Xanax per day. We discussed his long term benzo use and I explained that I would not be increasing benzo dose.  We  agreed to: Continue Lamictal 400 mg daily Continue Xanax 1 mg daily for severe anxiety. 90 day supply this time. Explained no early fills.  Reduce Trintellix to 5 mg daily for one week and then stop. Could not afford medication. To start Viibryd 10 mg for 7 days, then 20 mg daily Will report side effects or worsening symtoms To follow up in 4 weeks to reassess Provided emergency contact information Discussed potential benefits, risk, and side effects of benzodiazepines to include potential risk of tolerance and dependence, as well as possible drowsiness.  Advised patient not to drive if experiencing drowsiness and to take lowest possible effective dose to minimize risk of dependence and tolerance.  Reviewed PDMP     Elwanda Brooklyn, NP

## 2022-04-27 ENCOUNTER — Telehealth: Payer: Self-pay | Admitting: Behavioral Health

## 2022-04-27 NOTE — Telephone Encounter (Signed)
Brian-let me check into his cost of Trintellix and I will let you know.

## 2022-04-27 NOTE — Telephone Encounter (Signed)
Pt called reporting Viibryd is causing him to be mean his wife said and more agitated.Trintellix worked well too costly. Is Pt assistance possibility? Pt used insurance and copay card. One income family. Apt 8/30 Contact pt @ [phone]

## 2022-04-27 NOTE — Telephone Encounter (Signed)
Ok, thanks.

## 2022-04-27 NOTE — Telephone Encounter (Signed)
Contacted his pharmacy and the cost of Trintellix 20 mg is $219.74 for a 30 day and using the co-pay card. Pt has a high deductible that has not been met yet.  We are going to have pt fill out forms for pt. Assistance, although he has used co-pay card and has Pharmacist, community. Per information from [facility] they will still help if pt can not afford the medication. Leda Gauze printed out the forms and we will get those to him.   Aaron Edelman, his next apt is 05/09/22 but do you want to switch him back to Trintellix next week and use samples or see him first?

## 2022-04-30 NOTE — Telephone Encounter (Signed)
We can just wait at this point until he sees me next week.

## 2022-05-09 ENCOUNTER — Encounter: Payer: Self-pay | Admitting: Behavioral Health

## 2022-05-09 ENCOUNTER — Telehealth (INDEPENDENT_AMBULATORY_CARE_PROVIDER_SITE_OTHER): Payer: 59 | Admitting: Behavioral Health

## 2022-05-09 DIAGNOSIS — F331 Major depressive disorder, recurrent, moderate: Secondary | ICD-10-CM

## 2022-05-09 DIAGNOSIS — F411 Generalized anxiety disorder: Secondary | ICD-10-CM

## 2022-05-09 NOTE — Progress Notes (Addendum)
Dillon Price [id number] [birth year] 46 y.o.  Virtual Visit via Video Note  I connected with pt @ on 05/09/22 at  3:00 PM EDT by a video enabled telemedicine application and verified that I am speaking with the correct person using two identifiers.   I discussed the limitations of evaluation and management by telemedicine and the availability of in person appointments. The patient expressed understanding and agreed to proceed.  I discussed the assessment and treatment plan with the patient. The patient was provided an opportunity to ask questions and all were answered. The patient agreed with the plan and demonstrated an understanding of the instructions.   The patient was advised to call back or seek an in-person evaluation if the symptoms worsen or if the condition fails to improve as anticipated.  I provided 20 minutes of non-face-to-face time during this encounter.  The patient was located at home.  The provider was located at [place].   Elwanda Brooklyn, NP   Subjective:   Patient ID:  Dillon Price is a 46 y.o. (DOB Aug 29, 1976) male.  Chief Complaint:  Chief Complaint  Patient presents with   Anxiety   Depression   Follow-up   Medication Problem   Medication Refill   Patient Education    HPI "[name]", 46  year old male presents to this office for follow up and medication management.  Says he does not believe this medication is working well referring to [employer]. Say that his wife says that his irritability has not improved. He did acknowledge that his BP has been elevated and his MD has now written a second medication to help control. Now, he requesting trial of another medication. He says his anxiety today is 5/10 and depression is 3/10. He denies any current mania and cannot remember the last time occurred. He says that he sleeps 7-8 hours per night. Denies psychosis, no hx of auditory or visual hallucinations. No SI/HI.    Previous psychiatric medication trials:    Wellbutrin Zolft Prozac Klonopin Lithium Tegretol Celexa Latuda Lexapro Paxil Luvox Effexor Pristiq Abilify Seroquel Zyprexa Risperidone Depakote Lamictal       Review of Systems:  Review of Systems  Constitutional: Negative.   Allergic/Immunologic: Negative.   Neurological: Negative.   Psychiatric/Behavioral:  Positive for dysphoric mood. The patient is nervous/anxious.     Medications: I have reviewed the patient's current medications.  Current Outpatient Medications  Medication Sig Dispense Refill   acetaminophen (TYLENOL) 500 MG tablet Take 500 mg by mouth every 6 (six) hours as needed.     ALPRAZolam (XANAX) 1 MG tablet Take 1 tablet (1 mg total) by mouth daily as needed for anxiety. 90 tablet 0   benzonatate (TESSALON) 100 MG capsule Take 1 capsule (100 mg total) by mouth every 8 (eight) hours. 21 capsule 0   ibuprofen (ADVIL,MOTRIN) 200 MG tablet Take 400 mg by mouth every 6 (six) hours as needed for moderate pain.      lamoTRIgine (LAMICTAL) 200 MG tablet Take 2 tablets (400 mg total) by mouth daily. 180 tablet 1   verapamil (CALAN-SR) 120 MG CR tablet Take 120 mg by mouth daily.   11   Vilazodone HCl 20 MG TABS Take 1 tablet (20 mg total) by mouth daily. 30 tablet 1   vortioxetine HBr (TRINTELLIX) 10 MG TABS tablet Take 1 tablet (10 mg total) by mouth daily. 30 tablet 1   vortioxetine HBr (TRINTELLIX) 20 MG TABS tablet Take 1 tablet (20 mg total) by mouth daily.  30 tablet 1   No current facility-administered medications for this visit.    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   Hypertension 07/10/2016   Osteoarthritis of both hands 07/10/2016    Mild    Plantar fasciitis, left 07/10/2016    Family History  Problem Relation Age of Onset   Depression Mother    Bipolar disorder Father    Suicidality Father    Depression Paternal Uncle    Anxiety disorder Paternal Uncle    Suicidality Paternal Grandfather     Social History   Socioeconomic History   Marital status: Married    Spouse name: Not on file   Number of children: 5   Years of education: 12   Highest education level: High school graduate  Occupational History   Occupation: Catering manager for [employer]  Tobacco Use   Smoking status: Former   Smokeless tobacco: Never  Substance and Sexual Activity   Alcohol use: No   Drug use: Not Currently   Sexual activity: Yes  Other Topics Concern   Not on file  Social History Narrative   Lives in [place] with wife. Has 4 children from current wife and one child he later discovered as adult. He acknowledges his [employer] faith as a big part of his life. Goes to church 3 days per week.    Social Determinants of Health   Financial Resource Strain: Not on file  Food Insecurity: Not on file  Transportation Needs: Not on file  Physical Activity: Not on file  Stress: Not on file  Social Connections: Not on file  Intimate Partner Violence: Not on file    Past Medical History, Surgical history, Social history, and Family history were reviewed and updated as appropriate.   Please see review of systems for further details on the patient's review from today.   Objective:   Physical Exam:  There were no vitals taken for this visit.  Physical Exam Constitutional:      General: He is not in acute distress.    Appearance: Normal appearance.  Neurological:     Mental Status: He is alert and oriented to person, place, and time.     Gait: Gait normal.  Psychiatric:        Attention and Perception: Attention and perception normal. He does not perceive auditory or visual hallucinations.         Mood and Affect: Mood and affect normal. Mood is not anxious or depressed. Affect is not labile.        Speech: Speech normal.        Behavior: Behavior normal. Behavior is cooperative.        Thought Content: Thought content normal.        Cognition and Memory: Cognition and memory normal.        Judgment: Judgment normal.     Lab Review:     Component Value Date/Time   NA 138 08/02/2020 1506   NA 141 02/11/2017 1407   K 3.4 (L) 08/02/2020 1506   K 3.9 02/11/2017 1407   CL 101 08/02/2020 1506   CO2 26 08/02/2020 1506   CO2 28 02/11/2017 1407   GLUCOSE 109 (H)  08/02/2020 1506   GLUCOSE 105 02/11/2017 1407   BUN 9 08/02/2020 1506   BUN 11.4 02/11/2017 1407   CREATININE 0.99 08/02/2020 1506   CREATININE 0.9 02/11/2017 1407   CALCIUM 8.7 (L) 08/02/2020 1506   CALCIUM 9.3 02/11/2017 1407   PROT 7.0 02/11/2017 1407   PROT 7.5 02/11/2017 1407   ALBUMIN 4.3 02/11/2017 1407   AST 16 02/11/2017 1407   ALT 22 02/11/2017 1407   ALKPHOS 91 02/11/2017 1407   BILITOT 0.27 02/11/2017 1407   GFRNONAA >60 08/02/2020 1506   GFRAA >60 02/25/2011 2146       Component Value Date/Time   WBC 4.4 08/02/2020 1506   RBC 5.41 08/02/2020 1506   HGB 16.0 08/02/2020 1506   HGB 14.1 02/11/2017 1407   HCT 47.0 08/02/2020 1506   HCT 41.9 02/11/2017 1407   PLT 211 08/02/2020 1506   PLT 238 02/11/2017 1407   MCV 86.9 08/02/2020 1506   MCV 85.0 02/11/2017 1407   MCH 29.6 08/02/2020 1506   MCHC 34.0 08/02/2020 1506   RDW 12.4 08/02/2020 1506   RDW 13.1 02/11/2017 1407   LYMPHSABS 1.2 02/11/2017 1407   MONOABS 0.3 02/11/2017 1407   EOSABS 0.1 02/11/2017 1407   BASOSABS 0.0 02/11/2017 1407    No results found for: "POCLITH", "LITHIUM"   No results found for: "PHENYTOIN", "PHENOBARB", "VALPROATE", "CBMZ"   .res Assessment: Plan:    Greater than 50% of 20 min face to face time with patient was spent on counseling and coordination of care. We talked about his decline and now self report of  minimal improvement with anxiety and depression. Says his anxiety level is not where he would like but has been diligent in not taking more than 1 mg Xanax per day. I explained that he has been on many different medications without success. We agreed to increase the Viibryd for now and then conduct [facility] testing next visit.  We agreed to: Continue Lamictal 400 mg daily Continue Xanax 1 mg daily for severe anxiety. 90 day supply this time. Explained no early fills.  Increase Viibryd to 40 mg daily. To follow up in 4 weeks to reassess Provided emergency contact information Discussed potential benefits, risk, and side effects of benzodiazepines to include potential risk of tolerance and dependence, as well as possible drowsiness.  Advised patient not to drive if experiencing drowsiness and to take lowest possible effective dose to minimize risk of dependence and tolerance.  Reviewed PDMP         Piero was seen today for anxiety, depression, follow-up, medication problem, medication refill and patient education.  Diagnoses and all orders for this visit:  Generalized anxiety disorder  Major depressive disorder, recurrent episode, moderate ([place])     Please see After Visit Summary for patient specific instructions.  No future appointments.  No orders of the defined types were placed in this encounter.     -------------------------------

## 2022-05-21 ENCOUNTER — Telehealth: Payer: Self-pay | Admitting: Behavioral Health

## 2022-05-21 MED ORDER — VILAZODONE HCL 40 MG PO TABS: 40.0000 mg | ORAL_TABLET | Freq: Every day | ORAL | 0 refills | Status: DC

## 2022-05-21 NOTE — Telephone Encounter (Signed)
Next visit is 06/05/22. Dillon Price called and wants to know if he can increase his Vilazadone from 20 to 40 mg as the 20 is not helping enough. His phone number is [phone].

## 2022-05-21 NOTE — Telephone Encounter (Signed)
Rx for 40 mg sent to requested pharmacy. Per 8/30 note dose increased but new Rx was not sent. Patient said he had been without for 2 days.

## 2022-06-05 ENCOUNTER — Ambulatory Visit (INDEPENDENT_AMBULATORY_CARE_PROVIDER_SITE_OTHER): Payer: 59 | Admitting: Behavioral Health

## 2022-06-05 ENCOUNTER — Encounter: Payer: Self-pay | Admitting: Behavioral Health

## 2022-06-05 DIAGNOSIS — F331 Major depressive disorder, recurrent, moderate: Secondary | ICD-10-CM

## 2022-06-05 DIAGNOSIS — F411 Generalized anxiety disorder: Secondary | ICD-10-CM | POA: Diagnosis not present

## 2022-06-05 MED ORDER — ALPRAZOLAM 1 MG PO TABS: 1.0000 mg | ORAL_TABLET | Freq: Every day | ORAL | 0 refills | Status: DC | PRN

## 2022-06-05 MED ORDER — LAMOTRIGINE 200 MG PO TABS: 400.0000 mg | ORAL_TABLET | Freq: Every day | ORAL | 1 refills | Status: DC

## 2022-06-05 NOTE — Progress Notes (Signed)
[facility]  Patient ID: Dillon Price,  MRN: [id number]  PCP: Patient, No Pcp Per  Date of Evaluation: 06/05/2022 Time spent:30 minutes  Chief Complaint:  Chief Complaint   Anxiety; Depression; Follow-up; Medication Refill; Medication Problem     HISTORY/CURRENT STATUS: HPI "Dillon Price", 46  year old male presents to this office for follow up and medication management. Says that he accidentally took double dose of Viibryd for a week but he did not like the medication and decided to quit. Says he is frustrated at how much Trintellix cost but felt that medication was working. He understands he has a very lengthy medication list.  Says that he is going to try going through patient assistance for Trintellix. Now, he requesting trial of another medication. He says his anxiety today is 5/10 and depression is 3/10. He denies any current mania and cannot remember the last time occurred. He says that he sleeps 7-8 hours per night. Denies psychosis, no hx of auditory or visual hallucinations. No SI/HI.    Previous psychiatric medication trials:   Wellbutrin Zolft Prozac Klonopin Lithium Tegretol Celexa Latuda Lexapro Paxil Luvox Effexor Pristiq Abilify Seroquel Zyprexa Risperidone Depakote Lamictal         Individual Medical History/ Review of Systems: Changes? :No   Allergies: Patient has no known allergies.  Current Medications:  Current Outpatient Medications:    acetaminophen (TYLENOL) 500 MG tablet, Take 500 mg by mouth every 6 (six) hours as needed., Disp: , Rfl:    ALPRAZolam (XANAX) 1 MG tablet, Take 1 tablet (1 mg total) by mouth daily as needed for anxiety., Disp: 90 tablet, Rfl: 0   benzonatate (TESSALON) 100 MG capsule, Take 1 capsule (100 mg total) by mouth every 8 (eight) hours., Disp: 21 capsule, Rfl: 0   ibuprofen (ADVIL,MOTRIN) 200 MG tablet, Take 400 mg by mouth every 6 (six) hours as needed for moderate pain. , Disp: , Rfl:    lamoTRIgine  (LAMICTAL) 200 MG tablet, Take 2 tablets (400 mg total) by mouth daily., Disp: 180 tablet, Rfl: 1   verapamil (CALAN-SR) 120 MG CR tablet, Take 120 mg by mouth daily. , Disp: , Rfl: 11   Vilazodone HCl (VIIBRYD) 40 MG TABS, Take 1 tablet (40 mg total) by mouth daily., Disp: 30 tablet, Rfl: 0   vortioxetine HBr (TRINTELLIX) 10 MG TABS tablet, Take 1 tablet (10 mg total) by mouth daily., Disp: 30 tablet, Rfl: 1   vortioxetine HBr (TRINTELLIX) 20 MG TABS tablet, Take 1 tablet (20 mg total) by mouth daily., Disp: 30 tablet, Rfl: 1 Medication Side Effects: none  Family Medical/ Social History: Changes? No  MENTAL HEALTH EXAM:  There were no vitals taken for this visit.There is no height or weight on file to calculate BMI.  General Appearance: Casual, Neat, and Well Groomed  Eye Contact:  Good  Speech:  Clear and Coherent  Volume:  Normal  Mood:  Anxious and Depressed  Affect:  Appropriate, Congruent, and Depressed  Thought Process:  Coherent  Orientation:  Full (Time, Place, and Person)  Thought Content: Logical   Suicidal Thoughts:  No  Homicidal Thoughts:  No  Memory:  WNL  Judgement:  Good  Insight:  Good  Psychomotor Activity:  Normal  Concentration:  Concentration: Good  Recall:  Good  Fund of Knowledge: Good  Language: Good  Assets:  Desire for Improvement  ADL's:  Intact  Cognition: WNL  Prognosis:  Good    DIAGNOSES:    ICD-10-CM   1.  Generalized anxiety disorder  F41.1 lamoTRIgine (LAMICTAL) 200 MG tablet    ALPRAZolam (XANAX) 1 MG tablet    2. Major depressive disorder, recurrent episode, moderate (HCC)  F33.1 lamoTRIgine (LAMICTAL) 200 MG tablet      Receiving Psychotherapy: No    RECOMMENDATIONS:    Greater than 50% of 30 min face to face time with patient was spent on counseling and coordination of care. Talked about stopping medication without notifying office. He is wanting to apply for patient assistance for Trintellix. Feels like it is only medication  that started to work. Says his anxiety level is not where he would like but has been diligent in not taking more than 1 mg Xanax per day. I explained that he has been on many different medications without success.  We agreed to: Continue Lamictal 400 mg daily Continue Xanax 1 mg daily for severe anxiety. 90 day supply this time. Explained no early fills.  Pt stopped Viibryd To apply for patient assistance for Trintellix To follow up in 4 weeks to reassess Provided emergency contact information Discussed potential benefits, risk, and side effects of benzodiazepines to include potential risk of tolerance and dependence, as well as possible drowsiness.  Advised patient not to drive if experiencing drowsiness and to take lowest possible effective dose to minimize risk of dependence and tolerance.  Reviewed PDMP     Elwanda Brooklyn, NP

## 2022-06-20 ENCOUNTER — Telehealth: Payer: Self-pay | Admitting: Behavioral Health

## 2022-06-20 NOTE — Telephone Encounter (Signed)
Patient lvm at 10:28 regarding Trintellix stating that he would like to get back on it. He asked if he still had a prescription for medicine and if he could get samples to get him  through as medicine is about $200 and he would like full dosage. Ph: [phone] Appt 11/28 [place]

## 2022-06-20 NOTE — Telephone Encounter (Signed)
Yes, but we cannot keep him in permanent samples. Traci, could you possibly reach out and explain this to him or see if there are alternatives?

## 2022-06-20 NOTE — Telephone Encounter (Signed)
As noted in August pt was to fill out forms for pt. Assistance. Not sure he received forms or filled them out. Pt has a high deductible. Will reach out to pt again.

## 2022-06-20 NOTE — Telephone Encounter (Signed)
Please review.I see a PA was done earlier this year so I'm not sure if that $200 is his deductible or the cost of med with insurance but a note from July stated "Patient is saying even with co-pay card and insurance it is too expensive."

## 2022-06-22 NOTE — Telephone Encounter (Signed)
Pt LVM @ 1:10p.  He said he was returning call from the nurse.  Pls call him back.

## 2022-06-26 NOTE — Telephone Encounter (Signed)
Left pt message to follow up on pt. Assistance for Trintellix and if he did receive forms to complete.

## 2022-07-24 ENCOUNTER — Telehealth: Payer: Self-pay | Admitting: Behavioral Health

## 2022-07-24 NOTE — Telephone Encounter (Signed)
Put in a sticky note. Thank you.

## 2022-07-24 NOTE — Telephone Encounter (Signed)
Jerman has been approved for pt. Assistance for his Trintellix until 07/18/23.  The medication will be mailed directly to him. He has received his first shipment.  Please note  in his chart that he does have this assistance.

## 2022-08-07 ENCOUNTER — Ambulatory Visit (INDEPENDENT_AMBULATORY_CARE_PROVIDER_SITE_OTHER): Payer: 59 | Admitting: Behavioral Health

## 2022-08-07 ENCOUNTER — Encounter: Payer: Self-pay | Admitting: Behavioral Health

## 2022-08-07 DIAGNOSIS — F331 Major depressive disorder, recurrent, moderate: Secondary | ICD-10-CM

## 2022-08-07 DIAGNOSIS — F411 Generalized anxiety disorder: Secondary | ICD-10-CM | POA: Diagnosis not present

## 2022-08-07 MED ORDER — ALPRAZOLAM 1 MG PO TABS: 1.0000 mg | ORAL_TABLET | Freq: Every day | ORAL | 1 refills | Status: DC | PRN

## 2022-08-07 MED ORDER — HYDROXYZINE PAMOATE 25 MG PO CAPS: 25.0000 mg | ORAL_CAPSULE | Freq: Three times a day (TID) | ORAL | 1 refills | Status: DC | PRN

## 2022-08-07 NOTE — Progress Notes (Signed)
[facility]  Patient ID: Dillon Price,  MRN: [id number]  PCP: Patient, No Pcp Per  Date of Evaluation: 08/07/2022 Time spent:30 minutes  Chief Complaint:  Chief Complaint   Depression; Anxiety; Follow-up; Medication Refill; Medication Problem; Patient Education     HISTORY/CURRENT STATUS: HPI  Dillon Price", 46  year old male presents to this office for follow up and medication management. Says he was able to get patient assistance for Trintellix and they sent him 90 day supply. He feels the medication is working well at this point. Only been on the 20 mg dose for 9 days. He would like to try another non-benzo to help with increased anxiety. Says his anxiety has been rough lately due to stress from kids at home and the holidays.  He says his anxiety today is 6/10 and depression is 2/10. He denies any current mania and cannot remember the last time occurred. He says that he sleeps 7-8 hours per night. Denies psychosis, no hx of auditory or visual hallucinations. No SI/HI.    Previous psychiatric medication trials:   Wellbutrin Zolft Prozac Klonopin Lithium Tegretol Celexa Latuda Lexapro Paxil Luvox Effexor Pristiq Abilify Seroquel Zyprexa Risperidone Depakote Lamictal Buspar          Individual Medical History/ Review of Systems: Changes? :No   Allergies: Patient has no known allergies.  Current Medications:  Current Outpatient Medications:    hydrOXYzine (VISTARIL) 25 MG capsule, Take 1 capsule (25 mg total) by mouth 3 (three) times daily as needed., Disp: 30 capsule, Rfl: 1   acetaminophen (TYLENOL) 500 MG tablet, Take 500 mg by mouth every 6 (six) hours as needed., Disp: , Rfl:    ALPRAZolam (XANAX) 1 MG tablet, Take 1 tablet (1 mg total) by mouth daily as needed for anxiety., Disp: 90 tablet, Rfl: 1   benzonatate (TESSALON) 100 MG capsule, Take 1 capsule (100 mg total) by mouth every 8 (eight) hours., Disp: 21 capsule, Rfl: 0   ibuprofen  (ADVIL,MOTRIN) 200 MG tablet, Take 400 mg by mouth every 6 (six) hours as needed for moderate pain. , Disp: , Rfl:    lamoTRIgine (LAMICTAL) 200 MG tablet, Take 2 tablets (400 mg total) by mouth daily., Disp: 180 tablet, Rfl: 1   verapamil (CALAN-SR) 120 MG CR tablet, Take 120 mg by mouth daily. , Disp: , Rfl: 11   vortioxetine HBr (TRINTELLIX) 20 MG TABS tablet, Take 1 tablet (20 mg total) by mouth daily., Disp: 30 tablet, Rfl: 1 Medication Side Effects: none  Family Medical/ Social History: Changes? No  MENTAL HEALTH EXAM:  There were no vitals taken for this visit.There is no height or weight on file to calculate BMI.  General Appearance: Casual, Neat, and Well Groomed  Eye Contact:  Good  Speech:  Clear and Coherent  Volume:  Normal  Mood:  Anxious  Affect:  Appropriate  Thought Process:  Coherent  Orientation:  Full (Time, Place, and Person)  Thought Content: Logical   Suicidal Thoughts:  No  Homicidal Thoughts:  No  Memory:  WNL  Judgement:  Good  Insight:  Good  Psychomotor Activity:  Normal  Concentration:  Concentration: Good  Recall:  Good  Fund of Knowledge: Good  Language: Good  Assets:  Desire for Improvement  ADL's:  Intact  Cognition: WNL  Prognosis:  Good    DIAGNOSES:    ICD-10-CM   1. Generalized anxiety disorder  F41.1 hydrOXYzine (VISTARIL) 25 MG capsule    ALPRAZolam (XANAX) 1 MG tablet  2. Major depressive disorder, recurrent episode, moderate (HCC)  F33.1 hydrOXYzine (VISTARIL) 25 MG capsule      Receiving Psychotherapy: No     RECOMMENDATIONS:   Greater than 50% of 30 min face to face time with patient was spent on counseling and coordination of care. He was able to get patient assistance for Trintellix. He says that medication is working well. He does have some increased anxiety due to children at home and holiday. We discussed his increased supplementation with xanax and patient did agree to try hydroxyzine before he has to use his xanax.   We agreed to: To start hydroxyzine 25 mg three times daily as needed  Continue Lamictal 400 mg daily Continue Xanax 1 mg daily for severe anxiety. 90 day supply this time. Explained no early fills.  Continue Trintellix 20 mg daily To follow up in 8 weeks. Reviewed PDMP Provided emergency contact information Discussed potential benefits, risk, and side effects of benzodiazepines to include potential risk of tolerance and dependence, as well as possible drowsiness.  Advised patient not to drive if experiencing drowsiness and to take lowest possible effective dose to minimize risk of dependence and tolerance.  Reviewed PDMP  Elwanda Brooklyn, NP

## 2022-08-17 ENCOUNTER — Other Ambulatory Visit: Payer: Self-pay | Admitting: Behavioral Health

## 2022-08-17 DIAGNOSIS — F411 Generalized anxiety disorder: Secondary | ICD-10-CM

## 2022-08-17 DIAGNOSIS — F331 Major depressive disorder, recurrent, moderate: Secondary | ICD-10-CM

## 2022-09-21 ENCOUNTER — Other Ambulatory Visit: Payer: Self-pay

## 2022-09-21 DIAGNOSIS — F331 Major depressive disorder, recurrent, moderate: Secondary | ICD-10-CM

## 2022-09-21 DIAGNOSIS — F411 Generalized anxiety disorder: Secondary | ICD-10-CM

## 2022-09-21 MED ORDER — HYDROXYZINE PAMOATE 25 MG PO CAPS: 25.0000 mg | ORAL_CAPSULE | Freq: Three times a day (TID) | ORAL | 0 refills | Status: DC | PRN

## 2022-10-01 ENCOUNTER — Other Ambulatory Visit: Payer: Self-pay | Admitting: Behavioral Health

## 2022-10-01 DIAGNOSIS — F411 Generalized anxiety disorder: Secondary | ICD-10-CM

## 2022-10-01 DIAGNOSIS — F331 Major depressive disorder, recurrent, moderate: Secondary | ICD-10-CM

## 2022-10-09 ENCOUNTER — Encounter: Payer: Self-pay | Admitting: Behavioral Health

## 2022-10-09 ENCOUNTER — Ambulatory Visit (INDEPENDENT_AMBULATORY_CARE_PROVIDER_SITE_OTHER): Payer: 59 | Admitting: Behavioral Health

## 2022-10-09 DIAGNOSIS — F331 Major depressive disorder, recurrent, moderate: Secondary | ICD-10-CM | POA: Diagnosis not present

## 2022-10-09 DIAGNOSIS — F411 Generalized anxiety disorder: Secondary | ICD-10-CM | POA: Diagnosis not present

## 2022-10-09 DIAGNOSIS — R454 Irritability and anger: Secondary | ICD-10-CM

## 2022-10-09 MED ORDER — VORTIOXETINE HBR 20 MG PO TABS: 20.0000 mg | ORAL_TABLET | Freq: Every day | ORAL | 1 refills | Status: DC

## 2022-10-09 MED ORDER — ALPRAZOLAM 1 MG PO TABS: 1.0000 mg | ORAL_TABLET | Freq: Every day | ORAL | 1 refills | Status: DC | PRN

## 2022-10-09 MED ORDER — LAMOTRIGINE 200 MG PO TABS: 400.0000 mg | ORAL_TABLET | Freq: Every day | ORAL | 1 refills | Status: DC

## 2022-10-09 MED ORDER — HYDROXYZINE PAMOATE 25 MG PO CAPS: 25.0000 mg | ORAL_CAPSULE | Freq: Three times a day (TID) | ORAL | 0 refills | Status: DC | PRN

## 2022-10-09 NOTE — Progress Notes (Signed)
[facility]  Patient ID: Oakland Fant,  MRN: [id number]  PCP: Patient, No Pcp Per  Date of Evaluation: 10/09/2022 Time spent:20 minutes  Chief Complaint:  Chief Complaint   Depression; Anxiety; Follow-up; Medication Refill; Patient Education     HISTORY/CURRENT STATUS: HPI  Toriano", 47  year old male presents to this office for follow up and medication management. Says he has been doing well overall. He has had some increased stress at home during the holidays.   He says his anxiety today is 4/10 and depression is 2/10. Does not want to change or adjust medication this visit. Says he needs to follow up with PCP for his continued elevated BP.  He denies any current mania and cannot remember the last time occurred. He says that he sleeps 7-8 hours per night. Denies psychosis, no hx of auditory or visual hallucinations. No SI/HI.    Previous psychiatric medication trials:   Wellbutrin Zolft Prozac Klonopin Lithium Tegretol Celexa Latuda Lexapro Paxil Luvox Effexor Pristiq Abilify Seroquel Zyprexa Risperidone Depakote Lamictal Buspar           Individual Medical History/ Review of Systems: Changes? :No   Allergies: Patient has no known allergies.  Current Medications:  Current Outpatient Medications:    ALPRAZolam (XANAX) 1 MG tablet, Take 1 tablet (1 mg total) by mouth daily as needed for anxiety., Disp: 90 tablet, Rfl: 1   hydrOXYzine (VISTARIL) 25 MG capsule, Take 1 capsule (25 mg total) by mouth 3 (three) times daily as needed., Disp: 90 capsule, Rfl: 0   ibuprofen (ADVIL,MOTRIN) 200 MG tablet, Take 400 mg by mouth every 6 (six) hours as needed for moderate pain. , Disp: , Rfl:    lamoTRIgine (LAMICTAL) 200 MG tablet, Take 2 tablets (400 mg total) by mouth daily., Disp: 180 tablet, Rfl: 1   lisinopril (ZESTRIL) 10 MG tablet, Take 10 mg by mouth daily., Disp: , Rfl:    verapamil (CALAN-SR) 120 MG CR tablet, Take 120 mg by mouth daily. , Disp: ,  Rfl: 11   vortioxetine HBr (TRINTELLIX) 20 MG TABS tablet, Take 1 tablet (20 mg total) by mouth daily., Disp: 30 tablet, Rfl: 1 Medication Side Effects: none  Family Medical/ Social History: Changes? No  MENTAL HEALTH EXAM:  There were no vitals taken for this visit.There is no height or weight on file to calculate BMI.  General Appearance: Casual, Neat, and Well Groomed  Eye Contact:  Good  Speech:  Clear and Coherent  Volume:  Normal  Mood:  Anxious  Affect:  Appropriate  Thought Process:  Coherent  Orientation:  Full (Time, Place, and Person)  Thought Content: Logical   Suicidal Thoughts:  No  Homicidal Thoughts:  No  Memory:  WNL  Judgement:  Good  Insight:  Good  Psychomotor Activity:  Normal  Concentration:  Concentration: Good  Recall:  Good  Fund of Knowledge: Good  Language: Good  Assets:  Desire for Improvement  ADL's:  Intact  Cognition: WNL  Prognosis:  Good    DIAGNOSES:    ICD-10-CM   1. Major depressive disorder, recurrent episode, moderate (HCC)  F33.1     2. Generalized anxiety disorder  F41.1     3. Irritability  R45.4       Receiving Psychotherapy: No    RECOMMENDATIONS:   Greater than 50% of 30 min face to face time with patient was spent on counseling and coordination of care. We discussed his current stability.  He does have some increased  anxiety recently. However, his BP has been hight and he understands he needs to f/u with PCP about his BP meds.  We agreed to: To start hydroxyzine 25 mg three times daily as needed. May take 50 mg at once if needed. Continue Lamictal 400 mg daily Continue Xanax 1 mg daily for severe anxiety. 90 day supply this time. Explained no early fills.  Continue Trintellix 20 mg daily To follow up in 3 months Reviewed PDMP Provided emergency contact information Discussed potential benefits, risk, and side effects of benzodiazepines to include potential risk of tolerance and dependence, as well as possible  drowsiness.  Advised patient not to drive if experiencing drowsiness and to take lowest possible effective dose to minimize risk of dependence and tolerance.  Reviewed PDMP      Elwanda Brooklyn, NP

## 2022-10-12 ENCOUNTER — Other Ambulatory Visit: Payer: Self-pay | Admitting: Behavioral Health

## 2022-10-12 DIAGNOSIS — F411 Generalized anxiety disorder: Secondary | ICD-10-CM

## 2022-10-12 DIAGNOSIS — F331 Major depressive disorder, recurrent, moderate: Secondary | ICD-10-CM

## 2022-10-17 ENCOUNTER — Other Ambulatory Visit: Payer: Self-pay | Admitting: Behavioral Health

## 2022-10-17 DIAGNOSIS — F331 Major depressive disorder, recurrent, moderate: Secondary | ICD-10-CM

## 2022-10-17 DIAGNOSIS — F411 Generalized anxiety disorder: Secondary | ICD-10-CM

## 2022-12-04 ENCOUNTER — Emergency Department (HOSPITAL_BASED_OUTPATIENT_CLINIC_OR_DEPARTMENT_OTHER)
Admission: EM | Admit: 2022-12-04 | Discharge: 2022-12-04 | Disposition: A | Discharge status: Home / Self Care | Payer: 59 | Attending: Emergency Medicine | Admitting: Emergency Medicine

## 2022-12-04 ENCOUNTER — Other Ambulatory Visit: Payer: Self-pay

## 2022-12-04 ENCOUNTER — Emergency Department (HOSPITAL_BASED_OUTPATIENT_CLINIC_OR_DEPARTMENT_OTHER): Payer: 59

## 2022-12-04 ENCOUNTER — Other Ambulatory Visit (HOSPITAL_BASED_OUTPATIENT_CLINIC_OR_DEPARTMENT_OTHER): Payer: Self-pay

## 2022-12-04 ENCOUNTER — Encounter (HOSPITAL_BASED_OUTPATIENT_CLINIC_OR_DEPARTMENT_OTHER): Payer: Self-pay

## 2022-12-04 DIAGNOSIS — I10 Essential (primary) hypertension: Secondary | ICD-10-CM | POA: Diagnosis not present

## 2022-12-04 DIAGNOSIS — N200 Calculus of kidney: Secondary | ICD-10-CM | POA: Diagnosis not present

## 2022-12-04 DIAGNOSIS — N503 Cyst of epididymis: Secondary | ICD-10-CM | POA: Insufficient documentation

## 2022-12-04 DIAGNOSIS — R109 Unspecified abdominal pain: Secondary | ICD-10-CM

## 2022-12-04 DIAGNOSIS — Z87891 Personal history of nicotine dependence: Secondary | ICD-10-CM | POA: Diagnosis not present

## 2022-12-04 DIAGNOSIS — N433 Hydrocele, unspecified: Secondary | ICD-10-CM | POA: Insufficient documentation

## 2022-12-04 LAB — COMPREHENSIVE METABOLIC PANEL
ALT: 12 U/L (ref 0–44)
AST: 13 U/L — ABNORMAL LOW (ref 15–41)
Albumin: 5 g/dL (ref 3.5–5.0)
Alkaline Phosphatase: 87 U/L (ref 38–126)
Anion gap: 6 (ref 5–15)
BUN: 14 mg/dL (ref 6–20)
CO2: 30 mmol/L (ref 22–32)
Calcium: 10 mg/dL (ref 8.9–10.3)
Chloride: 103 mmol/L (ref 98–111)
Creatinine, Ser: 1.03 mg/dL (ref 0.61–1.24)
GFR, Estimated: >60 mL/min (ref >60)
Glucose, Bld: 117 mg/dL — ABNORMAL HIGH (ref 70–99)
Potassium: 4.2 mmol/L (ref 3.5–5.1)
Sodium: 139 mmol/L (ref 135–145)
Total Bilirubin: 0.5 mg/dL (ref 0.3–1.2)
Total Protein: 8 g/dL (ref 6.5–8.1)

## 2022-12-04 LAB — LACTIC ACID, PLASMA: Lactic Acid, Venous: 0.7 mmol/L (ref 0.5–1.9)

## 2022-12-04 LAB — URINALYSIS, ROUTINE W REFLEX MICROSCOPIC
Bilirubin Urine: NEGATIVE
Glucose, UA: NEGATIVE mg/dL
Hgb urine dipstick: NEGATIVE
Ketones, ur: NEGATIVE mg/dL
Leukocytes,Ua: NEGATIVE
Nitrite: NEGATIVE
Protein, ur: NEGATIVE mg/dL
Specific Gravity, Urine: 1.024 (ref 1.005–1.030)
pH: 7 (ref 5.0–8.0)

## 2022-12-04 LAB — CBC
HCT: 45.8 % (ref 39.0–52.0)
Hemoglobin: 15.9 g/dL (ref 13.0–17.0)
MCH: 29.5 pg (ref 26.0–34.0)
MCHC: 34.7 g/dL (ref 30.0–36.0)
MCV: 85 fL (ref 80.0–100.0)
Platelets: 271 10*3/uL (ref 150–400)
RBC: 5.39 MIL/uL (ref 4.22–5.81)
RDW: 12.3 % (ref 11.5–15.5)
WBC: 8.3 10*3/uL (ref 4.0–10.5)
nRBC: 0 % (ref 0.0–0.2)

## 2022-12-04 LAB — LIPASE, BLOOD: Lipase: 38 U/L (ref 11–51)

## 2022-12-04 MED ORDER — KETOROLAC TROMETHAMINE 15 MG/ML IJ SOLN
15.0000 mg | Freq: Once | INTRAMUSCULAR | Status: AC
  Administered 2022-12-04: 15 mg via INTRAVENOUS
  Filled 2022-12-04: qty 1

## 2022-12-04 MED ORDER — HYDROMORPHONE HCL 1 MG/ML IJ SOLN
0.5000 mg | Freq: Once | INTRAMUSCULAR | Status: AC
  Administered 2022-12-04: 0.5 mg via INTRAVENOUS
  Filled 2022-12-04: qty 1

## 2022-12-04 MED ORDER — ONDANSETRON HCL 4 MG PO TABS
4.0000 mg | ORAL_TABLET | ORAL | 0 refills | Status: DC | PRN
  Filled 2022-12-04: qty 12, 2d supply, fill #0

## 2022-12-04 MED ORDER — SUCRALFATE 1 G PO TABS
1.0000 g | ORAL_TABLET | Freq: Three times a day (TID) | ORAL | 0 refills | Status: DC
  Filled 2022-12-04: qty 1, 1d supply, fill #0
  Filled 2022-12-04: qty 20, 6d supply, fill #0

## 2022-12-04 MED ORDER — OXYCODONE HCL 5 MG PO TABS: 5.0000 mg | ORAL_TABLET | ORAL | 0 refills | Status: DC | PRN

## 2022-12-04 MED ORDER — HYDROMORPHONE HCL 1 MG/ML IJ SOLN
1.0000 mg | Freq: Once | INTRAMUSCULAR | Status: AC
  Administered 2022-12-04: 1 mg via INTRAVENOUS
  Filled 2022-12-04: qty 1

## 2022-12-04 MED ORDER — PANTOPRAZOLE SODIUM 20 MG PO TBEC: 20.0000 mg | DELAYED_RELEASE_TABLET | Freq: Every day | ORAL | 0 refills | Status: DC

## 2022-12-04 MED ORDER — ONDANSETRON HCL 4 MG/2ML IJ SOLN
4.0000 mg | Freq: Once | INTRAMUSCULAR | Status: AC
  Administered 2022-12-04: 4 mg via INTRAVENOUS
  Filled 2022-12-04: qty 2

## 2022-12-04 MED ORDER — SODIUM CHLORIDE 0.9 % IV BOLUS: 500.0000 mL | Freq: Once | INTRAVENOUS | Status: DC

## 2022-12-04 MED ORDER — SODIUM CHLORIDE 0.9 % IV BOLUS
1000.0000 mL | Freq: Once | INTRAVENOUS | Status: AC
  Administered 2022-12-04: 1000 mL via INTRAVENOUS

## 2022-12-04 MED ORDER — IOHEXOL 300 MG/ML  SOLN
100.0000 mL | Freq: Once | INTRAMUSCULAR | Status: AC | PRN
  Administered 2022-12-04: 85 mL via INTRAVENOUS

## 2022-12-04 MED ORDER — LIDOCAINE VISCOUS HCL 2 % MT SOLN
15.0000 mL | Freq: Once | OROMUCOSAL | Status: AC
  Administered 2022-12-04: 15 mL via ORAL
  Filled 2022-12-04: qty 15

## 2022-12-04 MED ORDER — ALUM & MAG HYDROXIDE-SIMETH 200-200-20 MG/5ML PO SUSP
30.0000 mL | Freq: Once | ORAL | Status: AC
  Administered 2022-12-04: 30 mL via ORAL
  Filled 2022-12-04: qty 30

## 2022-12-04 MED ORDER — SUCRALFATE 1 G PO TABS: 1.0000 g | ORAL_TABLET | Freq: Three times a day (TID) | ORAL | 0 refills | Status: DC

## 2022-12-04 MED ORDER — ONDANSETRON HCL 4 MG PO TABS: 4.0000 mg | ORAL_TABLET | ORAL | 0 refills | Status: DC | PRN

## 2022-12-04 MED ORDER — FAMOTIDINE IN NACL 20-0.9 MG/50ML-% IV SOLN
20.0000 mg | Freq: Once | INTRAVENOUS | Status: AC
  Administered 2022-12-04: 20 mg via INTRAVENOUS
  Filled 2022-12-04: qty 50

## 2022-12-04 NOTE — ED Provider Notes (Signed)
[facility] Provider Note  CSN: [id number] Arrival date & time: 12/04/22 1119  Chief Complaint(s) Abdominal Pain and Testicle Pain  HPI Dillon Price is a 47 y.o. male with past medical history as below, significant for DDD, chronic diarrhea, MGUS, HTN who presents to the ED with complaint of abdominal pain, testicle pain, nausea, vomiting, flank pain.  Onset approximate 5 days ago, gradually worsening.  Pain initially periumbilical, left lower quadrant, pain wrapping around to bilateral flanks.  Also pain to his left testicle.  No hematuria or penile discharge.  Having intermittent diarrhea without BRBPR or melena.  Vomited on the way to the emergency department from urgent care.  Denies similar symptoms in the past.  History of prior kidney stone but past spontaneously.  No prior abdominal surgeries.  No abdominal trauma.  Pain sharp, stabbing.  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/10/2016   Hypertension 07/10/2016   Osteoarthritis of both hands 07/10/2016   Mild    Plantar fasciitis, left 07/10/2016   Patient Active Problem List   Diagnosis Date Noted   MGUS (monoclonal gammopathy of unknown significance) 08/20/2016   Osteoarthritis of both hands 07/10/2016   DDD (degenerative disc disease), thoracic 07/10/2016   DDD (degenerative disc disease), cervical 07/10/2016   Plantar fasciitis, left 07/10/2016   Anxiety 07/10/2016   Hypertension 07/10/2016   Chronic diarrhea 07/10/2016   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 Authorizing Provider  ondansetron (ZOFRAN) 4 MG  tablet Take 1 tablet (4 mg total) by mouth every 4 (four) hours as needed for nausea or vomiting. 12/04/22  Yes Wynona Dove A, DO  oxyCODONE (ROXICODONE) 5 MG immediate release tablet Take 1 tablet (5 mg total) by mouth every 4 (four) hours as needed for up to 12 doses for severe pain. 12/04/22  Yes Wynona Dove A, DO  pantoprazole (PROTONIX) 20 MG tablet Take 1 tablet (20 mg total) by mouth daily for 14 days. 12/04/22 12/18/22 Yes Wynona Dove A, DO  sucralfate (CARAFATE) 1 g tablet Take 1 tablet (1 g total) by mouth with breakfast, with lunch, and with evening meal for 7 days. 12/04/22 12/11/22 Yes Jeanell Sparrow, DO  ALPRAZolam Duanne Moron) 1 MG tablet Take 1 tablet (1 mg total) by mouth daily as needed for anxiety. 10/09/22   Elwanda Brooklyn, NP  hydrOXYzine (VISTARIL) 25 MG capsule Take 1 capsule (25 mg total) by mouth 3 (three) times daily as needed. 10/12/22   Elwanda Brooklyn, NP  ibuprofen (ADVIL,MOTRIN) 200 MG tablet Take 400 mg by mouth every 6 (six) hours as needed for moderate pain.     [provider]  lamoTRIgine (LAMICTAL) 200 MG tablet Take 2 tablets (400 mg total) by mouth daily. 10/09/22   Elwanda Brooklyn, NP  lisinopril (ZESTRIL) 10 MG tablet Take 10 mg by mouth daily. 09/25/22   [provider]  verapamil (CALAN-SR) 120 MG CR tablet Take 120 mg by mouth daily.  06/14/16   [provider]  Vilazodone HCl 20 MG TABS TAKE ONE TABLET BY MOUTH one time DAILY 10/12/22   Elwanda Brooklyn, NP  vortioxetine HBr ([place])  20 MG TABS tablet Take 1 tablet (20 mg total) by mouth daily. 10/09/22   Elwanda Brooklyn, NP                                                                                                                                    Past Surgical History Past Surgical History:  Procedure Laterality Date   ANKLE FRACTURE SURGERY     Family History Family History  Problem Relation Age of Onset   Depression Mother    Bipolar disorder Father    Suicidality Father     Depression Paternal Uncle    Anxiety disorder Paternal Uncle    Suicidality Paternal Grandfather     Social History Social History   Tobacco Use   Smoking status: Former   Smokeless tobacco: Never  Substance Use Topics   Alcohol use: No   Drug use: Not Currently   Allergies Patient has no known allergies.  Review of Systems Review of Systems  Constitutional:  Negative for chills and fever.  HENT:  Negative for facial swelling and trouble swallowing.   Eyes:  Negative for photophobia and visual disturbance.  Respiratory:  Negative for cough and shortness of breath.   Cardiovascular:  Negative for chest pain and palpitations.  Gastrointestinal:  Positive for abdominal pain, diarrhea, nausea and vomiting.  Endocrine: Negative for polydipsia and polyuria.  Genitourinary:  Positive for difficulty urinating and testicular pain. Negative for hematuria.  Musculoskeletal:  Negative for gait problem and joint swelling.  Skin:  Negative for pallor and rash.  Neurological:  Negative for syncope and headaches.  Psychiatric/Behavioral:  Negative for agitation and confusion.     Physical Exam Vital Signs  I have reviewed the triage vital signs BP 116/77   Pulse 82   Temp 98.2 F (36.8 C) (Oral)   Resp 12   SpO2 100%  Physical Exam Vitals and nursing note reviewed. Exam conducted with a chaperone present.  Constitutional:      General: He is not in acute distress.    Appearance: Normal appearance. He is well-developed.  HENT:     Head: Normocephalic and atraumatic.     Right Ear: External ear normal.     Left Ear: External ear normal.     Mouth/Throat:     Mouth: Mucous membranes are moist.  Eyes:     General: No scleral icterus. Cardiovascular:     Rate and Rhythm: Normal rate and regular rhythm.     Pulses: Normal pulses.     Heart sounds: Normal heart sounds.  Pulmonary:     Effort: Pulmonary effort is normal. No respiratory distress.     Breath sounds: Normal breath  sounds.  Abdominal:     General: Abdomen is flat.     Palpations: Abdomen is soft.     Tenderness: There is abdominal tenderness. There is no guarding or rebound.    Genitourinary:  Penis: Normal.      Testes: Cremasteric reflex is present.        Left: Tenderness present.       Comments: TTP at inguinal canal L, no large palpable hernia. No sig ttp to either testicle.  Musculoskeletal:     Cervical back: No rigidity.     Right lower leg: No edema.     Left lower leg: No edema.  Skin:    General: Skin is warm and dry.     Capillary Refill: Capillary refill takes less than 2 seconds.  Neurological:     Mental Status: He is alert and oriented to person, place, and time.     GCS: GCS eye subscore is 4. GCS verbal subscore is 5. GCS motor subscore is 6.  Psychiatric:        Mood and Affect: Mood normal.        Behavior: Behavior normal.     ED Results and Treatments Labs (all labs ordered are listed, but only abnormal results are displayed) Labs Reviewed  COMPREHENSIVE METABOLIC PANEL - Abnormal; Notable for the following components:      Result Value   Glucose, Bld 117 (*)    AST 13 (*)    All other components within normal limits  LIPASE, BLOOD  CBC  URINALYSIS, ROUTINE W REFLEX MICROSCOPIC  LACTIC ACID, PLASMA  LACTIC ACID, PLASMA                                                                                                                          Radiology CT ABDOMEN PELVIS W CONTRAST  Result Date: 12/04/2022 CLINICAL DATA:  Left flank, inguinal and abdominal pain. EXAM: CT ABDOMEN AND PELVIS WITH CONTRAST TECHNIQUE: Multidetector CT imaging of the abdomen and pelvis was performed using the standard protocol following bolus administration of intravenous contrast. RADIATION DOSE REDUCTION: This exam was performed according to the departmental dose-optimization program which includes automated exposure control, adjustment of the mA and/or kV according to patient  size and/or use of iterative reconstruction technique. CONTRAST:  62mL OMNIPAQUE IOHEXOL 300 MG/ML  SOLN COMPARISON:  CT 02/25/2011 FINDINGS: Lower chest: Lung bases are clear.  No pleural effusion. Hepatobiliary: No focal liver abnormality is seen. No gallstones, gallbladder wall thickening, or biliary dilatation. Pancreas: Unremarkable. No pancreatic ductal dilatation or surrounding inflammatory changes. Spleen: Normal in size without focal abnormality. Adrenals/Urinary Tract: The adrenal glands are preserved. Multiple bilateral nonobstructing renal stones. Largest on the right is seen towards the upper pole measuring 5 mm. Largest on the left 2-3 mm. Collecting system dilatation. Small bilateral renal cysts are again identified. Example on the right measures 9 mm and left 9 mm. Bosniak 1 and 2 lesions. No specific imaging follow-up. A few of these were seen on the prior examination. Preserved contours of the underdistended urinary bladder. Stomach/Bowel: The large bowel has a normal course and caliber. Few sigmoid colon diverticula. Mild stool. Normal appendix in the right lower quadrant best seen  on coronal imaging. The stomach is relatively collapsed. The small bowel is nondilated. Vascular/Lymphatic: Normal caliber aorta and IVC. No discrete abnormal lymph node enlargement identified in the abdomen and pelvis. Reproductive: Prostate is unremarkable. Other: Small fat containing umbilical hernia. No free air or free fluid identified. Musculoskeletal: Slight curvature of the lumbar spine. Minimal degenerative changes. There are some disc bulging at L5-S1. IMPRESSION: No bowel obstruction, free air or free fluid. Scattered stool. Normal appendix. Few sigmoid colon diverticula. Bilateral nonobstructing renal stones.  No ureteral stones. Electronically Signed   By: Jill Side M.D.   On: 12/04/2022 12:52   US SCROTUM W/DOPPLER  Result Date: 12/04/2022 CLINICAL DATA:  Left-sided testicular pain EXAM: SCROTAL  ULTRASOUND DOPPLER ULTRASOUND OF THE TESTICLES TECHNIQUE: Complete ultrasound examination of the testicles, epididymis, and other scrotal structures was performed. Color and spectral Doppler ultrasound were also utilized to evaluate blood flow to the testicles. COMPARISON:  February 25, 2011 FINDINGS: Right testicle Measurements: 4.3 x 2.3 x 2.9. No mass or microlithiasis visualized. Left testicle Measurements: 4.4 x 2.2 x 3.2. No mass or microlithiasis visualized. Right epididymis:  Normal in size and appearance. Left epididymis:  12 mm left-sided epididymal cyst or spermatocele. Hydrocele:  Small hydroceles. Varicocele:  None visualized. Pulsed Doppler interrogation of both testes demonstrates normal low resistance arterial and venous waveforms bilaterally. IMPRESSION: Preserved testicular parenchyma and blood flow of the testicles on Doppler. Left-sided epididymal cyst or spermatocele measuring 12 mm. Small hydroceles Electronically Signed   By: Jill Side M.D.   On: 12/04/2022 12:45    Pertinent labs & imaging results that were available during my care of the patient were reviewed by me and considered in my medical decision making (see MDM for details).  Medications Ordered in ED Medications  HYDROmorphone (DILAUDID) injection 1 mg (1 mg Intravenous Given 12/04/22 1157)  sodium chloride 0.9 % bolus 1,000 mL (0 mLs Intravenous Stopped 12/04/22 1333)  ketorolac (TORADOL) 15 MG/ML injection 15 mg (15 mg Intravenous Given 12/04/22 1157)  ondansetron (ZOFRAN) injection 4 mg (4 mg Intravenous Given 12/04/22 1157)  famotidine (PEPCID) IVPB 20 mg premix (0 mg Intravenous Stopped 12/04/22 1333)  iohexol (OMNIPAQUE) 300 MG/ML solution 100 mL (85 mLs Intravenous Contrast Given 12/04/22 1237)  HYDROmorphone (DILAUDID) injection 0.5 mg (0.5 mg Intravenous Given 12/04/22 1301)  alum & mag hydroxide-simeth (MAALOX/MYLANTA) 200-200-20 MG/5ML suspension 30 mL (30 mLs Oral Given 12/04/22 1512)    And  lidocaine (XYLOCAINE) 2  % viscous mouth solution 15 mL (15 mLs Oral Given 12/04/22 1512)  ondansetron (ZOFRAN) injection 4 mg (4 mg Intravenous Given 12/04/22 1512)                                                                                                                                     Procedures Procedures  (including critical care time)  Medical Decision Making / ED Course    Medical Decision Making:  Dillon Price is a 47 y.o. male  with past medical history as below, significant for DDD, chronic diarrhea, MGUS, HTN who presents to the ED with complaint of abdominal pain, testicle pain, nausea, vomiting, flank pain.. The complaint involves an extensive differential diagnosis and also carries with it a high risk of complications and morbidity.  Serious etiology was considered. Ddx includes but is not limited to: Differential diagnosis includes but is not exclusive to acute appendicitis, renal colic, testicular torsion, urinary tract infection, prostatitis,  diverticulitis, small bowel obstruction, colitis, abdominal aortic aneurysm, gastroenteritis, constipation etc.   Complete initial physical exam performed, notably the patient  was NAD, abd not peritoneal. .    Reviewed and confirmed nursing documentation for past medical history, family history, social history.  Vital signs reviewed.    Clinical Course as of 12/04/22 1601  Tue Dec 04, 2022  1454 Feeling better after intervention. Labs stable, imaging stable.  [SG]  1505 Patient has some improvement with his symptoms, still feeling bloated, having gas sensation.  Will give Maalox.  Second antiemetic. [SG]    Clinical Course User Index [SG] Jeanell Sparrow, DO   Patient feeling better after intervention.  Indigestion/gas sensation resolved after Maalox.  Able to tolerate p.o. intake without difficulty.  Labs are stable, UA without infection.  Lactic acid not elevated.  HDS.  Afebrile.  Physical exam is reassuring.  CT imaging with small renal  stones, no hydronephrosis.  Ultrasound scrotum with hydrocele, epididymal cyst versus spermatocele.  No signs of torsion.  Exam of testicles is not concerning for abscess or torsion.  No obvious hernia inguinal.  Pain improved.  Presentation today multifactorial, recommend he follow-up with urology for further evaluation of epididymal cyst/hydrocele.  The patient improved significantly and was discharged in stable condition. Detailed discussions were had with the patient regarding current findings, and need for close f/u with PCP or on call doctor. The patient has been instructed to return immediately if the symptoms worsen in any way for re-evaluation. Patient verbalized understanding and is in agreement with current care plan. All questions answered prior to discharge.     Additional history obtained: -Additional history obtained from family -External records from outside source obtained and reviewed including: Chart review including previous notes, labs, imaging, consultation notes including PDMP, primary care documentation, prior labs and imaging, home medications   Lab Tests: -I ordered, reviewed, and interpreted labs.   The pertinent results include:   Labs Reviewed  COMPREHENSIVE METABOLIC PANEL - Abnormal; Notable for the following components:      Result Value   Glucose, Bld 117 (*)    AST 13 (*)    All other components within normal limits  LIPASE, BLOOD  CBC  URINALYSIS, ROUTINE W REFLEX MICROSCOPIC  LACTIC ACID, PLASMA  LACTIC ACID, PLASMA    Notable for as above stable  EKG   EKG Interpretation  Date/Time:    Ventricular Rate:    PR Interval:    QRS Duration:   QT Interval:    QTC Calculation:   R Axis:     Text Interpretation:           Imaging Studies ordered: I ordered imaging studies including CT abdomen, ultrasound scrotum I independently visualized the following imaging with scope of interpretation limited to determining acute life threatening  conditions related to emergency care; findings noted above, significant for kidney stone, hydrocele, spermatocele I independently visualized and interpreted imaging. I agree with the radiologist interpretation   Medicines ordered  and prescription drug management: Meds ordered this encounter  Medications   HYDROmorphone (DILAUDID) injection 1 mg   sodium chloride 0.9 % bolus 1,000 mL   ketorolac (TORADOL) 15 MG/ML injection 15 mg   ondansetron (ZOFRAN) injection 4 mg   famotidine (PEPCID) IVPB 20 mg premix   iohexol (OMNIPAQUE) 300 MG/ML solution 100 mL   HYDROmorphone (DILAUDID) injection 0.5 mg   DISCONTD: sodium chloride 0.9 % bolus 500 mL   AND Linked Order Group    alum & mag hydroxide-simeth (MAALOX/MYLANTA) 200-200-20 MG/5ML suspension 30 mL    lidocaine (XYLOCAINE) 2 % viscous mouth solution 15 mL   ondansetron (ZOFRAN) injection 4 mg   DISCONTD: sucralfate (CARAFATE) 1 g tablet    Sig: Take 1 tablet (1 g total) by mouth with breakfast, with lunch, and with evening meal for 7 days.    Dispense:  21 tablet    Refill:  0   DISCONTD: ondansetron (ZOFRAN) 4 MG tablet    Sig: Take 1 tablet (4 mg total) by mouth every 4 (four) hours as needed for nausea or vomiting.    Dispense:  12 tablet    Refill:  0   oxyCODONE (ROXICODONE) 5 MG immediate release tablet    Sig: Take 1 tablet (5 mg total) by mouth every 4 (four) hours as needed for up to 12 doses for severe pain.    Dispense:  12 tablet    Refill:  0   sucralfate (CARAFATE) 1 g tablet    Sig: Take 1 tablet (1 g total) by mouth with breakfast, with lunch, and with evening meal for 7 days.    Dispense:  21 tablet    Refill:  0   pantoprazole (PROTONIX) 20 MG tablet    Sig: Take 1 tablet (20 mg total) by mouth daily for 14 days.    Dispense:  14 tablet    Refill:  0   ondansetron (ZOFRAN) 4 MG tablet    Sig: Take 1 tablet (4 mg total) by mouth every 4 (four) hours as needed for nausea or vomiting.    Dispense:  12 tablet     Refill:  0    -I have reviewed the patients home medicines and have made adjustments as needed   Consultations Obtained: na   Cardiac Monitoring: The patient was maintained on a cardiac monitor.  I personally viewed and interpreted the cardiac monitored which showed an underlying rhythm of: SR  Social Determinants of Health:  Diagnosis or treatment significantly limited by social determinants of health: former smoker   Reevaluation: After the interventions noted above, I reevaluated the patient and found that they have improved  Co morbidities that complicate the patient evaluation  Past Medical History:  Diagnosis Date   Ankle fracture 1999   right    Anxiety 07/10/2016   Chronic diarrhea 07/10/2016   DDD (degenerative disc disease), cervical 07/10/2016   DDD (degenerative disc disease), lumbar 07/10/2016   DDD (degenerative disc disease), thoracic 07/10/2016   Hypertension 07/10/2016   Osteoarthritis of both hands 07/10/2016   Mild    Plantar fasciitis, left 07/10/2016      Dispostion: Disposition decision including need for hospitalization was considered, and patient discharged from emergency department.    Final Clinical Impression(s) / ED Diagnoses Final diagnoses:  Abdominal pain, unspecified abdominal location  Kidney stone  Hydrocele, unspecified hydrocele type  Epididymal cyst     This chart was dictated using voice recognition software.  Despite best efforts to proofread,  errors can occur which can change the documentation meaning.    Jeanell Sparrow, DO 12/04/22 1601

## 2022-12-04 NOTE — ED Triage Notes (Signed)
Pt states UC sent him to evaluate lower abd pain, testicular pain "back into like my kidneys," vomiting, "slow urine & a little bit of diarrhea." States pain started 5 days ago, "I thought it would go away but it's getting worse."

## 2022-12-04 NOTE — Discharge Instructions (Addendum)
It was a pleasure caring for you today in the emergency department.  Please return to the emergency department for any worsening or worrisome symptoms.  Recommend using supportive underwear for next 2 weeks  Please call urology / Dr Gloriann Loan tomorrow to arrange follow-up appointment

## 2022-12-13 ENCOUNTER — Other Ambulatory Visit: Payer: Self-pay | Admitting: Behavioral Health

## 2022-12-13 DIAGNOSIS — F411 Generalized anxiety disorder: Secondary | ICD-10-CM

## 2022-12-13 DIAGNOSIS — F331 Major depressive disorder, recurrent, moderate: Secondary | ICD-10-CM

## 2023-01-01 ENCOUNTER — Other Ambulatory Visit: Payer: Self-pay | Admitting: Behavioral Health

## 2023-01-01 DIAGNOSIS — F331 Major depressive disorder, recurrent, moderate: Secondary | ICD-10-CM

## 2023-01-01 DIAGNOSIS — F411 Generalized anxiety disorder: Secondary | ICD-10-CM

## 2023-01-07 ENCOUNTER — Encounter: Payer: Self-pay | Admitting: Behavioral Health

## 2023-01-07 ENCOUNTER — Ambulatory Visit (INDEPENDENT_AMBULATORY_CARE_PROVIDER_SITE_OTHER): Payer: 59 | Admitting: Behavioral Health

## 2023-01-07 DIAGNOSIS — F411 Generalized anxiety disorder: Secondary | ICD-10-CM | POA: Diagnosis not present

## 2023-01-07 DIAGNOSIS — F331 Major depressive disorder, recurrent, moderate: Secondary | ICD-10-CM

## 2023-01-07 MED ORDER — PROPRANOLOL HCL 10 MG PO TABS: 10.0000 mg | ORAL_TABLET | Freq: Three times a day (TID) | ORAL | 0 refills | Status: DC

## 2023-01-07 MED ORDER — ALPRAZOLAM 1 MG PO TABS: 1.0000 mg | ORAL_TABLET | Freq: Every day | ORAL | 1 refills | Status: DC | PRN

## 2023-01-07 MED ORDER — LAMOTRIGINE 200 MG PO TABS: 400.0000 mg | ORAL_TABLET | Freq: Every day | ORAL | 1 refills | Status: DC

## 2023-01-07 MED ORDER — TRINTELLIX 20 MG PO TABS: 20.0000 mg | ORAL_TABLET | Freq: Every day | ORAL | 0 refills | Status: DC

## 2023-01-07 NOTE — Progress Notes (Signed)
[facility]  Patient ID: Dillon Price,  MRN: [id number]  PCP: Patient, No Pcp Per  Date of Evaluation: 01/07/2023 Time spent:30 minutes  Chief Complaint:  Chief Complaint   Anxiety; Depression; Follow-up; Medication Problem; Patient Education; Medication Refill     HISTORY/CURRENT STATUS: HPI  "Dillon Price", 47  year old male presents to this office for follow up and medication management. Says he has been doing well overall. He was unable to take hydroxyzine. Says he did not feel right on the medication. He would like to consider another medication to help with his anxiety and irritability.   He says his anxiety today is 3/10 and depression is 2/10. Does not want to change or adjust medication this visit. Says he needs to follow up with PCP for his continued elevated BP.  He denies any current mania and cannot remember the last time occurred. He says that he sleeps 7-8 hours per night. Denies psychosis, no hx of auditory or visual hallucinations. No SI/HI.    Previous psychiatric medication trials:   Wellbutrin Zolft Prozac Klonopin Lithium Tegretol Celexa Latuda Lexapro Paxil Luvox Effexor Pristiq Abilify Seroquel Zyprexa Risperidone Depakote Lamictal Buspar          Individual Medical History/ Review of Systems: Changes? :No   Allergies: Patient has no known allergies.  Current Medications:  Current Outpatient Medications:    propranolol (INDERAL) 10 MG tablet, Take 1 tablet (10 mg total) by mouth 3 (three) times daily., Disp: 90 tablet, Rfl: 0   ALPRAZolam (XANAX) 1 MG tablet, Take 1 tablet (1 mg total) by mouth daily as needed for anxiety., Disp: 90 tablet, Rfl: 1   hydrOXYzine (VISTARIL) 25 MG capsule, TAKE ONE CAPSULE BY MOUTH THREE TIMES DAILY AS NEEDED, Disp: 90 capsule, Rfl: 0   ibuprofen (ADVIL,MOTRIN) 200 MG tablet, Take 400 mg by mouth every 6 (six) hours as needed for moderate pain. , Disp: , Rfl:    lamoTRIgine (LAMICTAL) 200 MG tablet,  Take 2 tablets (400 mg total) by mouth daily., Disp: 180 tablet, Rfl: 1   lisinopril (ZESTRIL) 10 MG tablet, Take 10 mg by mouth daily., Disp: , Rfl:    ondansetron (ZOFRAN) 4 MG tablet, Take 1 tablet (4 mg total) by mouth every 4 (four) hours as needed for nausea or vomiting., Disp: 12 tablet, Rfl: 0   oxyCODONE (ROXICODONE) 5 MG immediate release tablet, Take 1 tablet (5 mg total) by mouth every 4 (four) hours as needed for up to 12 doses for severe pain., Disp: 12 tablet, Rfl: 0   pantoprazole (PROTONIX) 20 MG tablet, Take 1 tablet (20 mg total) by mouth daily for 14 days., Disp: 14 tablet, Rfl: 0   sucralfate (CARAFATE) 1 g tablet, Take 1 tablet (1 g total) by mouth with breakfast, with lunch, and with evening meal for 7 days., Disp: 21 tablet, Rfl: 0   TRINTELLIX 20 MG TABS tablet, Take 1 tablet (20 mg total) by mouth daily., Disp: 90 tablet, Rfl: 0   verapamil (CALAN-SR) 120 MG CR tablet, Take 120 mg by mouth daily. , Disp: , Rfl: 11 Medication Side Effects: none  Family Medical/ Social History: Changes? No  MENTAL HEALTH EXAM:  There were no vitals taken for this visit.There is no height or weight on file to calculate BMI.  General Appearance: Casual, Neat, and Well Groomed  Eye Contact:  Good  Speech:  Clear and Coherent  Volume:  Normal  Mood:  NA  Affect:  Appropriate  Thought Process:  Coherent  Orientation:  Full (Time, Place, and Person)  Thought Content: Logical   Suicidal Thoughts:  No  Homicidal Thoughts:  No  Memory:  WNL  Judgement:  Good  Insight:  Good  Psychomotor Activity:  Normal  Concentration:  Concentration: Good  Recall:  Good  Fund of Knowledge: Good  Language: Good  Assets:  Desire for Improvement  ADL's:  Intact  Cognition: WNL  Prognosis:  Good    DIAGNOSES:    ICD-10-CM   1. Generalized anxiety disorder  F41.1 propranolol (INDERAL) 10 MG tablet    lamoTRIgine (LAMICTAL) 200 MG tablet    TRINTELLIX 20 MG TABS tablet    ALPRAZolam (XANAX) 1  MG tablet    2. Major depressive disorder, recurrent episode, moderate (HCC)  F33.1 lamoTRIgine (LAMICTAL) 200 MG tablet    TRINTELLIX 20 MG TABS tablet      Receiving Psychotherapy: No    RECOMMENDATIONS:   Greater than 50% of 30 min face to face time with patient was spent on counseling and coordination of care. We discussed his current stability.  He does have some increased anxiety recently. We agreed to: Stopped Hydroxyzine To start Inderal 10 mg three times daily as needed Continue Lamictal 400 mg daily Continue Xanax 1 mg daily for severe anxiety. 90 day supply this time. Explained no early fills.  Continue Trintellix 20 mg daily To follow up in 3 months Reviewed PDMP Provided emergency contact information Discussed potential benefits, risk, and side effects of benzodiazepines to include potential risk of tolerance and dependence, as well as possible drowsiness.  Advised patient not to drive if experiencing drowsiness and to take lowest possible effective dose to minimize risk of dependence and tolerance.  Reviewed PDMP     Joan Flores, NP

## 2023-04-08 ENCOUNTER — Encounter: Payer: Self-pay | Admitting: Behavioral Health

## 2023-04-08 ENCOUNTER — Ambulatory Visit (INDEPENDENT_AMBULATORY_CARE_PROVIDER_SITE_OTHER): Payer: 59 | Admitting: Behavioral Health

## 2023-04-08 DIAGNOSIS — F331 Major depressive disorder, recurrent, moderate: Secondary | ICD-10-CM

## 2023-04-08 DIAGNOSIS — F411 Generalized anxiety disorder: Secondary | ICD-10-CM

## 2023-04-08 MED ORDER — PROPRANOLOL HCL 10 MG PO TABS: 10.0000 mg | ORAL_TABLET | Freq: Three times a day (TID) | ORAL | 0 refills | Status: DC

## 2023-04-08 MED ORDER — LAMOTRIGINE 200 MG PO TABS: 400.0000 mg | ORAL_TABLET | Freq: Every day | ORAL | 1 refills | Status: DC

## 2023-04-08 MED ORDER — ALPRAZOLAM 1 MG PO TABS: 1.0000 mg | ORAL_TABLET | Freq: Every day | ORAL | 1 refills | Status: DC | PRN

## 2023-04-08 NOTE — Progress Notes (Signed)
[facility]  Patient ID: Kainon Dooney,  MRN: [id number]  PCP: Patient, No Pcp Per  Date of Evaluation: 04/08/2023 Time spent:30 minutes  Chief Complaint:  Chief Complaint   Anxiety; Depression; Follow-up; Medication Refill; Patient Education     HISTORY/CURRENT STATUS: HPI  "Dillon Price", 47  year old male presents to this office for follow up and medication management. Says he has been doing well overall.  Says Inderal is working well.  He says his anxiety today is 3/10 and depression is 2/10. Does not want to change or adjust medication this visit. Says he needs to follow up with PCP for his continued elevated BP.  He denies any current mania and cannot remember the last time occurred. He says that he sleeps 7-8 hours per night. Denies psychosis, no hx of auditory or visual hallucinations. No SI/HI.    Previous psychiatric medication trials:   Wellbutrin Zolft Prozac Klonopin Lithium Tegretol Celexa Latuda Lexapro Paxil Luvox Effexor Pristiq Abilify Seroquel Zyprexa Risperidone Depakote Lamictal Buspar           Individual Medical History/ Review of Systems: Changes? :No   Allergies: Patient has no known allergies.  Current Medications:  Current Outpatient Medications:    ALPRAZolam (XANAX) 1 MG tablet, Take 1 tablet (1 mg total) by mouth daily as needed for anxiety., Disp: 90 tablet, Rfl: 1   hydrOXYzine (VISTARIL) 25 MG capsule, TAKE ONE CAPSULE BY MOUTH THREE TIMES DAILY AS NEEDED, Disp: 90 capsule, Rfl: 0   ibuprofen (ADVIL,MOTRIN) 200 MG tablet, Take 400 mg by mouth every 6 (six) hours as needed for moderate pain. , Disp: , Rfl:    lamoTRIgine (LAMICTAL) 200 MG tablet, Take 2 tablets (400 mg total) by mouth daily., Disp: 180 tablet, Rfl: 1   lisinopril (ZESTRIL) 10 MG tablet, Take 10 mg by mouth daily., Disp: , Rfl:    ondansetron (ZOFRAN) 4 MG tablet, Take 1 tablet (4 mg total) by mouth every 4 (four) hours as needed for nausea or vomiting.,  Disp: 12 tablet, Rfl: 0   oxyCODONE (ROXICODONE) 5 MG immediate release tablet, Take 1 tablet (5 mg total) by mouth every 4 (four) hours as needed for up to 12 doses for severe pain., Disp: 12 tablet, Rfl: 0   pantoprazole (PROTONIX) 20 MG tablet, Take 1 tablet (20 mg total) by mouth daily for 14 days., Disp: 14 tablet, Rfl: 0   propranolol (INDERAL) 10 MG tablet, Take 1 tablet (10 mg total) by mouth 3 (three) times daily., Disp: 90 tablet, Rfl: 0   sucralfate (CARAFATE) 1 g tablet, Take 1 tablet (1 g total) by mouth with breakfast, with lunch, and with evening meal for 7 days., Disp: 21 tablet, Rfl: 0   TRINTELLIX 20 MG TABS tablet, Take 1 tablet (20 mg total) by mouth daily., Disp: 90 tablet, Rfl: 0   verapamil (CALAN-SR) 120 MG CR tablet, Take 120 mg by mouth daily. , Disp: , Rfl: 11 Medication Side Effects: none  Family Medical/ Social History: Changes? No  MENTAL HEALTH EXAM:  There were no vitals taken for this visit.There is no height or weight on file to calculate BMI.  General Appearance: Casual, Neat, and Well Groomed  Eye Contact:  Good  Speech:  Clear and Coherent  Volume:  Normal  Mood:  Anxious, Depressed, and Dysphoric  Affect:  Appropriate  Thought Process:  Coherent  Orientation:  Full (Time, Place, and Person)  Thought Content: Logical   Suicidal Thoughts:  No  Homicidal Thoughts:  No  Memory:  WNL  Judgement:  Good  Insight:  Good  Psychomotor Activity:  Normal  Concentration:  Concentration: Good  Recall:  Good  Fund of Knowledge: Good  Language: Good  Assets:  Desire for Improvement  ADL's:  Intact  Cognition: WNL  Prognosis:  Good    DIAGNOSES:    ICD-10-CM   1. Generalized anxiety disorder  F41.1 ALPRAZolam (XANAX) 1 MG tablet    propranolol (INDERAL) 10 MG tablet    lamoTRIgine (LAMICTAL) 200 MG tablet    2. Major depressive disorder, recurrent episode, moderate (HCC)  F33.1 lamoTRIgine (LAMICTAL) 200 MG tablet      Receiving Psychotherapy: No     RECOMMENDATIONS:   Greater than 50% of 30 min face to face time with patient was spent on counseling and coordination of care. We discussed his current stability. He is doing very well and is happy with his medications.    We agreed to:  To continue Inderal 10 mg three times daily as needed Continue Lamictal 400 mg daily Continue Xanax 1 mg daily for severe anxiety. 90 day supply this time. Explained no early fills.  Continue Trintellix 20 mg daily To follow up in 3 months Reviewed PDMP Provided emergency contact information Discussed potential benefits, risk, and side effects of benzodiazepines to include potential risk of tolerance and dependence, as well as possible drowsiness.  Advised patient not to drive if experiencing drowsiness and to take lowest possible effective dose to minimize risk of dependence and tolerance.  Reviewed PDMP      Joan Flores, NP

## 2023-04-16 ENCOUNTER — Other Ambulatory Visit: Payer: Self-pay | Admitting: Behavioral Health

## 2023-04-16 DIAGNOSIS — F411 Generalized anxiety disorder: Secondary | ICD-10-CM

## 2023-04-16 DIAGNOSIS — F331 Major depressive disorder, recurrent, moderate: Secondary | ICD-10-CM

## 2023-06-14 ENCOUNTER — Telehealth: Payer: Self-pay | Admitting: Behavioral Health

## 2023-06-17 NOTE — Telephone Encounter (Signed)
Error

## 2023-07-04 ENCOUNTER — Ambulatory Visit: Payer: 59 | Admitting: Professional Counselor

## 2023-07-09 ENCOUNTER — Ambulatory Visit (INDEPENDENT_AMBULATORY_CARE_PROVIDER_SITE_OTHER): Payer: 59 | Admitting: Behavioral Health

## 2023-07-09 ENCOUNTER — Encounter: Payer: Self-pay | Admitting: Behavioral Health

## 2023-07-09 DIAGNOSIS — F411 Generalized anxiety disorder: Secondary | ICD-10-CM | POA: Diagnosis not present

## 2023-07-09 DIAGNOSIS — F331 Major depressive disorder, recurrent, moderate: Secondary | ICD-10-CM | POA: Diagnosis not present

## 2023-07-09 MED ORDER — ALPRAZOLAM 1 MG PO TABS: 1.0000 mg | ORAL_TABLET | Freq: Every day | ORAL | 1 refills | Status: DC | PRN

## 2023-07-09 MED ORDER — PROPRANOLOL HCL 20 MG PO TABS: 20.0000 mg | ORAL_TABLET | Freq: Three times a day (TID) | ORAL | 1 refills | Status: DC

## 2023-07-09 MED ORDER — LAMOTRIGINE 200 MG PO TABS: 400.0000 mg | ORAL_TABLET | Freq: Every day | ORAL | 1 refills | Status: DC

## 2023-07-09 MED ORDER — TRINTELLIX 20 MG PO TABS: 20.0000 mg | ORAL_TABLET | Freq: Every day | ORAL | 0 refills | Status: DC

## 2023-07-09 NOTE — Progress Notes (Signed)
[facility]  Patient ID: Arlington Lonsway,  MRN: [id number]  PCP: Patient, No Pcp Per  Date of Evaluation: 07/09/2023 Time spent:30 minutes  Chief Complaint:  Chief Complaint   Anxiety; Depression; Follow-up; Medication Refill; Patient Education     HISTORY/CURRENT STATUS: HPI  "Dillon Price", 47  year old male presents to this office for follow up and medication management. Says he has been doing well overall.  Says Inderal is working well.  He says his anxiety today is 3/10 and depression is 2/10. Does not want to change or adjust medication this visit. Says he needs to follow up with PCP for his continued elevated BP.  He denies any current mania and cannot remember the last time occurred. He says that he sleeps 7-8 hours per night. Denies psychosis, no hx of auditory or visual hallucinations. No SI/HI.    Previous psychiatric medication trials:   Wellbutrin Zolft Prozac Klonopin Lithium Tegretol Celexa Latuda Lexapro Paxil Luvox Effexor Pristiq Abilify Seroquel Zyprexa Risperidone Depakote Lamictal Buspar       Individual Medical History/ Review of Systems: Changes? :No   Allergies: Patient has no known allergies.  Current Medications:  Current Outpatient Medications:    propranolol (INDERAL) 20 MG tablet, Take 1 tablet (20 mg total) by mouth 3 (three) times daily., Disp: 90 tablet, Rfl: 1   ALPRAZolam (XANAX) 1 MG tablet, Take 1 tablet (1 mg total) by mouth daily as needed for anxiety., Disp: 90 tablet, Rfl: 1   ibuprofen (ADVIL,MOTRIN) 200 MG tablet, Take 400 mg by mouth every 6 (six) hours as needed for moderate pain. , Disp: , Rfl:    lamoTRIgine (LAMICTAL) 200 MG tablet, Take 2 tablets (400 mg total) by mouth daily., Disp: 180 tablet, Rfl: 1   lisinopril (ZESTRIL) 10 MG tablet, Take 10 mg by mouth daily., Disp: , Rfl:    ondansetron (ZOFRAN) 4 MG tablet, Take 1 tablet (4 mg total) by mouth every 4 (four) hours as needed for nausea or vomiting.,  Disp: 12 tablet, Rfl: 0   oxyCODONE (ROXICODONE) 5 MG immediate release tablet, Take 1 tablet (5 mg total) by mouth every 4 (four) hours as needed for up to 12 doses for severe pain., Disp: 12 tablet, Rfl: 0   pantoprazole (PROTONIX) 20 MG tablet, Take 1 tablet (20 mg total) by mouth daily for 14 days., Disp: 14 tablet, Rfl: 0   propranolol (INDERAL) 10 MG tablet, Take 1 tablet (10 mg total) by mouth 3 (three) times daily., Disp: 90 tablet, Rfl: 0   sucralfate (CARAFATE) 1 g tablet, Take 1 tablet (1 g total) by mouth with breakfast, with lunch, and with evening meal for 7 days., Disp: 21 tablet, Rfl: 0   TRINTELLIX 20 MG TABS tablet, Take 1 tablet (20 mg total) by mouth daily., Disp: 90 tablet, Rfl: 0   verapamil (CALAN-SR) 120 MG CR tablet, Take 120 mg by mouth daily. , Disp: , Rfl: 11 Medication Side Effects: none  Family Medical/ Social History: Changes? No  MENTAL HEALTH EXAM:  There were no vitals taken for this visit.There is no height or weight on file to calculate BMI.  General Appearance: Casual, Neat, and Well Groomed  Eye Contact:  Good  Speech:  Clear and Coherent  Volume:  Normal  Mood:  NA  Affect:  Appropriate  Thought Process:  Coherent  Orientation:  Full (Time, Place, and Person)  Thought Content: Logical   Suicidal Thoughts:  No  Homicidal Thoughts:  No  Memory:  WNL  Judgement:  Good  Insight:  Good  Psychomotor Activity:  Normal  Concentration:  Concentration: Good  Recall:  Good  Fund of Knowledge: Good  Language: Good  Assets:  Desire for Improvement  ADL's:  Intact  Cognition: WNL  Prognosis:  Good    DIAGNOSES:    ICD-10-CM   1. Generalized anxiety disorder  F41.1 TRINTELLIX 20 MG TABS tablet    propranolol (INDERAL) 20 MG tablet    lamoTRIgine (LAMICTAL) 200 MG tablet    ALPRAZolam (XANAX) 1 MG tablet    2. Major depressive disorder, recurrent episode, moderate (HCC)  F33.1 TRINTELLIX 20 MG TABS tablet    propranolol (INDERAL) 20 MG tablet     lamoTRIgine (LAMICTAL) 200 MG tablet      Receiving Psychotherapy: No    RECOMMENDATIONS:   Greater than 50% of 30 min face to face time with patient was spent on counseling and coordination of care. We discussed his current stability. He is doing very well and is happy with his medications. Some family stressors but he is working through it. Does not feel like he needs psychotherapy at this time.    We agreed to:   To continue Inderal 10 mg three times daily as needed Continue Lamictal 400 mg daily Continue Xanax 1 mg daily for severe anxiety. 90 day supply this time. Explained no early fills.  Continue Trintellix 20 mg daily To follow up in 3 months Reviewed PDMP Provided emergency contact information Discussed potential benefits, risk, and side effects of benzodiazepines to include potential risk of tolerance and dependence, as well as possible drowsiness.  Advised patient not to drive if experiencing drowsiness and to take lowest possible effective dose to minimize risk of dependence and tolerance.  Reviewed PDMP    Joan Flores, NP

## 2023-08-05 ENCOUNTER — Other Ambulatory Visit: Payer: Self-pay | Admitting: Behavioral Health

## 2023-08-05 DIAGNOSIS — F331 Major depressive disorder, recurrent, moderate: Secondary | ICD-10-CM

## 2023-08-05 DIAGNOSIS — F411 Generalized anxiety disorder: Secondary | ICD-10-CM

## 2023-08-31 ENCOUNTER — Emergency Department (HOSPITAL_BASED_OUTPATIENT_CLINIC_OR_DEPARTMENT_OTHER): Payer: 59

## 2023-08-31 ENCOUNTER — Emergency Department (HOSPITAL_BASED_OUTPATIENT_CLINIC_OR_DEPARTMENT_OTHER)
Admission: EM | Admit: 2023-08-31 | Discharge: 2023-08-31 | Disposition: A | Discharge status: Home / Self Care | Payer: 59 | Attending: Emergency Medicine | Admitting: Emergency Medicine

## 2023-08-31 ENCOUNTER — Encounter (HOSPITAL_BASED_OUTPATIENT_CLINIC_OR_DEPARTMENT_OTHER): Payer: Self-pay

## 2023-08-31 ENCOUNTER — Other Ambulatory Visit: Payer: Self-pay

## 2023-08-31 DIAGNOSIS — R079 Chest pain, unspecified: Secondary | ICD-10-CM

## 2023-08-31 DIAGNOSIS — R0789 Other chest pain: Secondary | ICD-10-CM | POA: Insufficient documentation

## 2023-08-31 LAB — CBC
HCT: 44 % (ref 39.0–52.0)
Hemoglobin: 15.6 g/dL (ref 13.0–17.0)
MCH: 29.9 pg (ref 26.0–34.0)
MCHC: 35.5 g/dL (ref 30.0–36.0)
MCV: 84.5 fL (ref 80.0–100.0)
Platelets: 245 10*3/uL (ref 150–400)
RBC: 5.21 MIL/uL (ref 4.22–5.81)
RDW: 12.4 % (ref 11.5–15.5)
WBC: 5.9 10*3/uL (ref 4.0–10.5)
nRBC: 0 % (ref 0.0–0.2)

## 2023-08-31 LAB — TROPONIN I (HIGH SENSITIVITY)
Troponin I (High Sensitivity): <2 ng/L (ref <18)
Troponin I (High Sensitivity): <2 ng/L (ref <18)

## 2023-08-31 LAB — BASIC METABOLIC PANEL
Anion gap: 7 (ref 5–15)
BUN: 16 mg/dL (ref 6–20)
CO2: 30 mmol/L (ref 22–32)
Calcium: 9.7 mg/dL (ref 8.9–10.3)
Chloride: 103 mmol/L (ref 98–111)
Creatinine, Ser: 0.95 mg/dL (ref 0.61–1.24)
GFR, Estimated: >60 mL/min (ref >60)
Glucose, Bld: 109 mg/dL — ABNORMAL HIGH (ref 70–99)
Potassium: 3.4 mmol/L — ABNORMAL LOW (ref 3.5–5.1)
Sodium: 140 mmol/L (ref 135–145)

## 2023-08-31 MED ORDER — ACETAMINOPHEN 500 MG PO TABS
1000.0000 mg | ORAL_TABLET | Freq: Once | ORAL | Status: AC
  Administered 2023-08-31: 1000 mg via ORAL
  Filled 2023-08-31: qty 2

## 2023-08-31 NOTE — ED Triage Notes (Signed)
Pt to triage c/o chest pain 5/10 sharp in nature x 12 hours. PT denies SOB fever chills. EKG completed, Labs drawn. Pt A/O x 4 skin warm dry Pt on room air VSS NAD PT denies aspirin.

## 2023-08-31 NOTE — ED Provider Notes (Signed)
[place] EMERGENCY DEPARTMENT AT [facility] Provider Note   CSN: [id number] Arrival date & time: 08/31/23  1929     History  Chief Complaint  Patient presents with   Chest Pain    Bodin Wigger is a 47 y.o. male.  With a history of hypertension anxiety presents ED for chest pain.  He has had episodic chest pain over the last couple months and for this reason has seen a cardiologist and has an upcoming stress test scheduled in 2 days.  Today's episode of chest pain began shortly after waking.  5 out of 10 in severity localized over the left anterior chest without radiation to other regions.  No shortness of breath nausea vomiting or diaphoresis.  No fevers chills or recent illness.   Chest Pain      Home Medications Prior to Admission medications   Medication Sig Start Date End Date Taking? Authorizing Provider  ALPRAZolam Prudy Feeler) 1 MG tablet Take 1 tablet (1 mg total) by mouth daily as needed for anxiety. 07/09/23   Joan Flores, NP  ibuprofen (ADVIL,MOTRIN) 200 MG tablet Take 400 mg by mouth every 6 (six) hours as needed for moderate pain.     [provider]  lamoTRIgine (LAMICTAL) 200 MG tablet Take 2 tablets (400 mg total) by mouth daily. 07/09/23   Joan Flores, NP  lisinopril (ZESTRIL) 10 MG tablet Take 10 mg by mouth daily. 09/25/22   [provider]  ondansetron (ZOFRAN) 4 MG tablet Take 1 tablet (4 mg total) by mouth every 4 (four) hours as needed for nausea or vomiting. 12/04/22   Tanda Rockers A, DO  oxyCODONE (ROXICODONE) 5 MG immediate release tablet Take 1 tablet (5 mg total) by mouth every 4 (four) hours as needed for up to 12 doses for severe pain. 12/04/22   Sloan Leiter, DO  pantoprazole (PROTONIX) 20 MG tablet Take 1 tablet (20 mg total) by mouth daily for 14 days. 12/04/22 12/18/22  Sloan Leiter, DO  propranolol (INDERAL) 10 MG tablet Take 1 tablet (10 mg total) by mouth 3 (three) times daily. 04/08/23   Joan Flores, NP   propranolol (INDERAL) 20 MG tablet Take 1 tablet (20 mg total) by mouth 3 (three) times daily. 08/05/23   Joan Flores, NP  sucralfate (CARAFATE) 1 g tablet Take 1 tablet (1 g total) by mouth with breakfast, with lunch, and with evening meal for 7 days. 12/04/22 12/11/22  Tanda Rockers A, DO  TRINTELLIX 20 MG TABS tablet Take 1 tablet (20 mg total) by mouth daily. 07/09/23   Joan Flores, NP  verapamil (CALAN-SR) 120 MG CR tablet Take 120 mg by mouth daily.  06/14/16   [provider]      Allergies    Patient has no known allergies.    Review of Systems   Review of Systems  Cardiovascular:  Positive for chest pain.    Physical Exam Updated Vital Signs BP (!) 129/91   Pulse 63   Temp (!) 97.5 F (36.4 C)   Resp 17   Wt 92.5 kg   SpO2 96%   BMI 26.91 kg/m  Physical Exam Vitals and nursing note reviewed.  HENT:     Head: Normocephalic and atraumatic.  Eyes:     Pupils: Pupils are equal, round, and reactive to light.  Cardiovascular:     Rate and Rhythm: Normal rate and regular rhythm.  Pulmonary:     Effort: Pulmonary effort is normal.  Breath sounds: Normal breath sounds.  Abdominal:     Palpations: Abdomen is soft.     Tenderness: There is no abdominal tenderness.  Skin:    General: Skin is warm and dry.  Neurological:     Mental Status: He is alert.  Psychiatric:        Mood and Affect: Mood normal.     ED Results / Procedures / Treatments   Labs (all labs ordered are listed, but only abnormal results are displayed) Labs Reviewed  BASIC METABOLIC PANEL - Abnormal; Notable for the following components:      Result Value   Potassium 3.4 (*)    Glucose, Bld 109 (*)    All other components within normal limits  CBC  TROPONIN I (HIGH SENSITIVITY)  TROPONIN I (HIGH SENSITIVITY)    EKG EKG Interpretation Date/Time:  Saturday August 31 2023 19:36:27 EST Ventricular Rate:  60 PR Interval:  168 QRS Duration:  90 QT Interval:  420 QTC  Calculation: 420 R Axis:   24  Text Interpretation: Normal sinus rhythm Cannot rule out Anterior infarct , age undetermined Abnormal ECG When compared with ECG of 02-Aug-2020 14:48, Vent. rate has decreased BY  42 BPM T wave inversion no longer evident in Inferior leads Confirmed by Estelle June [phone]) on 08/31/2023 9:29:48 PM  Radiology DG Chest Port 1 View Result Date: 08/31/2023 CLINICAL DATA:  Chest pain EXAM: PORTABLE CHEST 1 VIEW COMPARISON:  08/02/2020 FINDINGS: Heart and mediastinal contours are within normal limits. No focal opacities or effusions. No acute bony abnormality. IMPRESSION: No active disease. Electronically Signed   By: Charlett Nose M.D.   On: 08/31/2023 20:08    Procedures Procedures    Medications Ordered in ED Medications  acetaminophen (TYLENOL) tablet 1,000 mg (has no administration in time range)    ED Course/ Medical Decision Making/ A&P                                 Medical Decision Making 47 year old male with history as above presenting for chest pain that began this morning.  5 out of 10 left-sided.  Afebrile normotensive here.  Comfortable appearing.  Benign physical exam.  Differential diagnosis includes ACS, anemia, dysrhythmia, pneumonia or viral respiratory infection.  High sensitive troponin and EKG not consistent with ACS.  No evidence of dysrhythmia no focal consolidation on chest x-ray or other significant laboratory abnormality.  Patient reports feeling well but has a mild headache here.  Will provide him with a dose of Tylenol and he will follow-up with his cardiologist as scheduled for stress test in 2 days.  Return precautions that be worrisome for worsening chest pain or trouble breathing were discussed.  Stable for discharge  Amount and/or Complexity of Data Reviewed Labs: ordered. Radiology: ordered.  Risk OTC drugs.           Final Clinical Impression(s) / ED Diagnoses Final diagnoses:  Chest pain, unspecified type     Rx / DC Orders ED Discharge Orders     None         Royanne Foots, DO 08/31/23 2200

## 2023-08-31 NOTE — Discharge Instructions (Signed)
You were seen in the emergency room for chest pain Your EKG chest x-ray and lab work all looked okay We do not think you are having a heart attack tonight Is important you follow-up with your cardiologist and have the test done as scheduled for next week Return to the emerged part for severe chest pain, trouble breathing or any other concerns

## 2023-09-02 ENCOUNTER — Telehealth: Payer: Self-pay | Admitting: Behavioral Health

## 2023-09-02 NOTE — Telephone Encounter (Signed)
Pt LVM @ 4:07p stating that he is having severe anxiety.  He said he went to the hospital because he thought he was having a heart attack but he's not.  He just wants Arlys John to call and talk to him.  Next appt 1/29

## 2023-09-02 NOTE — Telephone Encounter (Signed)
Patient reporting increased anxiety for the past 2 months that he had been dealing with. He went to ER a couple of days ago because he thought he was having an MI. He said his EKG was normal.  The note from ER mentions possible abnormalities and his labs were abnormal. He reports taking all medications as prescribed, except he said propranolol didn't seem helpful and he was not taking it TID as prescribed. No new stressors, no previous issues around Christmas. Reviewed sleep hygiene with him. No caffeine after lunch, watches TV with child, has 4 children and doesn't get to bed until about midnight.   Told him that we were off the next 3 days and that I might not her back from you today.

## 2023-09-06 NOTE — Telephone Encounter (Signed)
LVM to RC 

## 2023-09-09 NOTE — Telephone Encounter (Signed)
Patient said he had talked with cardiologist and he is ok.

## 2023-09-23 ENCOUNTER — Telehealth: Payer: Self-pay | Admitting: Behavioral Health

## 2023-09-23 NOTE — Telephone Encounter (Signed)
 Dillon Price has been re-approved for Pt. Assistance for his Trintellix until 09/19/2024.  He will receive his refill from [place].  Pt is aware.  Please note this in his chart/

## 2023-09-23 NOTE — Telephone Encounter (Signed)
 Sticky note created.

## 2023-10-09 ENCOUNTER — Ambulatory Visit: Payer: 59 | Admitting: Behavioral Health

## 2023-10-18 ENCOUNTER — Telehealth: Payer: 59 | Admitting: Behavioral Health

## 2023-10-18 ENCOUNTER — Encounter: Payer: Self-pay | Admitting: Behavioral Health

## 2023-10-18 DIAGNOSIS — F331 Major depressive disorder, recurrent, moderate: Secondary | ICD-10-CM

## 2023-10-18 DIAGNOSIS — F411 Generalized anxiety disorder: Secondary | ICD-10-CM

## 2023-10-18 MED ORDER — PROPRANOLOL HCL 20 MG PO TABS: 20.0000 mg | ORAL_TABLET | Freq: Three times a day (TID) | ORAL | 1 refills | Status: DC

## 2023-10-18 MED ORDER — LAMOTRIGINE 200 MG PO TABS: 400.0000 mg | ORAL_TABLET | Freq: Every day | ORAL | 1 refills | Status: DC

## 2023-10-18 MED ORDER — TRINTELLIX 20 MG PO TABS: 20.0000 mg | ORAL_TABLET | Freq: Every day | ORAL | 0 refills | Status: DC

## 2023-10-18 MED ORDER — ALPRAZOLAM 1 MG PO TABS: 1.0000 mg | ORAL_TABLET | Freq: Every day | ORAL | 1 refills | Status: DC | PRN

## 2023-10-18 NOTE — Progress Notes (Addendum)
 Dillon Price [id number] September 29, 1975 48 y.o.  Virtual Visit via Video Note  I connected with pt @ on 10/18/23 at  3:00 PM EST by a video enabled telemedicine application and verified that I am speaking with the correct person using two identifiers.   I discussed the limitations of evaluation and management by telemedicine and the availability of in person appointments. The patient expressed understanding and agreed to proceed.  I discussed the assessment and treatment plan with the patient. The patient was provided an opportunity to ask questions and all were answered. The patient agreed with the plan and demonstrated an understanding of the instructions.   The patient was advised to call back or seek an in-person evaluation if the symptoms worsen or if the condition fails to improve as anticipated.  I provided 30 minutes of non-face-to-face time during this encounter.  The patient was located at home.  The provider was located at [facility].   Dillon DELENA Pizza, Dillon Price   Subjective:   Patient ID:  Dillon Price is a 48 y.o. (DOB [birth year]) male.  Chief Complaint:  Chief Complaint  Patient presents with   Anxiety   Depression   Follow-up   Medication Refill   Patient Education    HPI  O[place], 48  year old male presents to this office via video visit for follow up and medication management. Says he has been doing well overall.  Says Inderal  is working well.  Had a scare and had to go to ER with Chest Pain in December. Treated and released but they were concerned he may have experienced MI at some point. Following up with Cardiology q 6 months.  He says his anxiety today is 3/10 and depression is 3/10. Does not want to change or adjust medication this visit. Says he needs to follow up with PCP for his continued elevated BP.  He denies any current mania and cannot remember the last time occurred. He says that he sleeps 7-8 hours per night. Denies psychosis, no hx of auditory or visual  hallucinations. No SI/HI.    Previous psychiatric medication trials:   Wellbutrin Zolft Prozac Klonopin Lithium Tegretol Celexa Latuda Lexapro Paxil Luvox Effexor Pristiq Abilify Seroquel Zyprexa Risperidone Depakote Lamictal  Buspar  Review of Systems:  Review of Systems  Constitutional: Negative.   Allergic/Immunologic: Negative.   Neurological: Negative.   Psychiatric/Behavioral: Negative.      Medications: I have reviewed the patient's current medications.  Current Outpatient Medications  Medication Sig Dispense Refill   ALPRAZolam  (XANAX ) 1 MG tablet Take 1 tablet (1 mg total) by mouth daily as needed for anxiety. 90 tablet 1   ibuprofen (ADVIL,MOTRIN) 200 MG tablet Take 400 mg by mouth every 6 (six) hours as needed for moderate pain.      lamoTRIgine  (LAMICTAL ) 200 MG tablet Take 2 tablets (400 mg total) by mouth daily. 180 tablet 1   lisinopril (ZESTRIL) 10 MG tablet Take 10 mg by mouth daily.     ondansetron  (ZOFRAN ) 4 MG tablet Take 1 tablet (4 mg total) by mouth every 4 (four) hours as needed for nausea or vomiting. 12 tablet 0   oxyCODONE  (ROXICODONE ) 5 MG immediate release tablet Take 1 tablet (5 mg total) by mouth every 4 (four) hours as needed for up to 12 doses for severe pain. 12 tablet 0   pantoprazole  (PROTONIX ) 20 MG tablet Take 1 tablet (20 mg total) by mouth daily for 14 days. 14 tablet 0   propranolol  (INDERAL ) 10 MG tablet  Take 1 tablet (10 mg total) by mouth 3 (three) times daily. 90 tablet 0   propranolol  (INDERAL ) 20 MG tablet Take 1 tablet (20 mg total) by mouth 3 (three) times daily. 270 tablet 1   sucralfate  (CARAFATE ) 1 g tablet Take 1 tablet (1 g total) by mouth with breakfast, with lunch, and with evening meal for 7 days. 21 tablet 0   TRINTELLIX  20 MG TABS tablet Take 1 tablet (20 mg total) by mouth daily. 90 tablet 0   verapamil (CALAN-SR) 120 MG CR tablet Take 120 mg by mouth daily.   11   No current facility-administered medications  for this visit.    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   Hypertension 07/10/2016   Osteoarthritis of both hands 07/10/2016   Mild    Plantar fasciitis, left 07/10/2016    Family History  Problem Relation Age of Onset   Depression Mother    Bipolar disorder Father    Suicidality Father    Depression Paternal Uncle    Anxiety disorder Paternal Uncle    Suicidality Paternal Grandfather     Social History   Socioeconomic History   Marital status: Married    Spouse name: Not on file   Number of children: 5   Years of education: 12   Highest education level: High school graduate  Occupational History   Occupation: Education Officer, [employer] for [employer]  Tobacco Use   Smoking status: Former   Smokeless tobacco: Never  Substance and Sexual Activity   Alcohol use: No   Drug use: Not Currently   Sexual activity: Yes  Other Topics Concern   Not on file  Social History Narrative   Lives in [place] with wife. Has 4 children from current wife and one child he later discovered as adult. He acknowledges his [employer]le[employer] faith as a big part of his life. Goes to church 3 days per week.    Social Drivers of Corporate Investment Banker Strain: Low Risk  (08/28/2023)   Received from [facility]   Overall Financial Resource Strain (CARDIA)    Difficulty of Paying Living Expenses: Not very hard  Food Insecurity: No Food Insecurity (08/28/2023)   Received from [facility]   Hunger Vital Sign    Worried About Running Out of Food in the Last Year: Never true    Ran Out of Food in the Last Year: Never true  Transportation Needs: No Transportation Needs  (08/28/2023)   Received from [facility] - Transportation    Lack of Transportation (Medical): No    Lack of Transportation (Non-Medical): No  Physical Activity: Insufficiently Active (08/28/2023)   Received from [facility]   Exercise Vital Sign    Days of Exercise per Week: 2 days    Minutes of Exercise per Session: 60 min  Stress: Stress Concern Present (08/28/2023)   Received from [facility] of Occupational Health - Occupational Stress Questionnaire    Feeling of Stress : Very much  Social Connections: Somewhat Isolated (08/28/2023)   Received from [facility]   Social Network    How would you rate your social network (family, work, friends)?:  Restricted participation with some degree of social isolation  Intimate Partner Violence: Not At Risk (08/28/2023)   Received from [facility]   HITS    Over the last 12 months how often did your partner physically hurt you?: Never    Over the last 12 months how often did your partner insult you or talk down to you?: Rarely    Over the last 12 months how often did your partner threaten you with physical harm?: Never    Over the last 12 months how often did your partner scream or curse at you?: Rarely    Past Medical History, Surgical history, Social history, and Family history were reviewed and updated as appropriate.   Please see review of systems for further details on the patient's review from today.   Objective:   Physical Exam:  There were no vitals taken for this visit.  Physical Exam Constitutional:      General: He is not in acute distress.    Appearance: Normal appearance.  Neurological:     Mental Status: He is alert and oriented to person, place, and time.     Gait: Gait normal.  Psychiatric:        Attention and Perception: Attention and perception normal. He does not perceive auditory or visual hallucinations.        Mood and Affect: Mood and affect normal. Mood is not  anxious or depressed. Affect is not labile.        Speech: Speech normal.        Behavior: Behavior normal. Behavior is cooperative.        Thought Content: Thought content normal.        Cognition and Memory: Cognition and memory normal.        Judgment: Judgment normal.     Lab Review:     Component Value Date/Time   NA 140 08/31/2023 1943   NA 141 02/11/2017 1407   K 3.4 (L) 08/31/2023 1943   K 3.9 02/11/2017 1407   CL 103 08/31/2023 1943   CO2 30 08/31/2023 1943   CO2 28 02/11/2017 1407   GLUCOSE 109 (H) 08/31/2023 1943   GLUCOSE 105 02/11/2017 1407   BUN 16 08/31/2023 1943   BUN 11.4 02/11/2017 1407   CREATININE 0.95 08/31/2023 1943   CREATININE 0.9 02/11/2017 1407   CALCIUM 9.7 08/31/2023 1943   CALCIUM 9.3 02/11/2017 1407   PROT 8.0 12/04/2022 1128   PROT 7.0 02/11/2017 1407   PROT 7.5 02/11/2017 1407   ALBUMIN 5.0 12/04/2022 1128   ALBUMIN 4.3 02/11/2017 1407   AST 13 (L) 12/04/2022 1128   AST 16 02/11/2017 1407   ALT 12 12/04/2022 1128   ALT 22 02/11/2017 1407   ALKPHOS 87 12/04/2022 1128   ALKPHOS 91 02/11/2017 1407   BILITOT 0.5 12/04/2022 1128   BILITOT 0.27 02/11/2017 1407   GFRNONAA >60 08/31/2023 1943   GFRAA >60 02/25/2011 2146       Component Value Date/Time   WBC 5.9 08/31/2023 1943   RBC 5.21 08/31/2023 1943   HGB 15.6 08/31/2023 1943   HGB 14.1 02/11/2017 1407   HCT 44.0 08/31/2023 1943   HCT 41.9 02/11/2017 1407   PLT 245 08/31/2023 1943   PLT 238 02/11/2017 1407   MCV 84.5 08/31/2023 1943   MCV 85.0 02/11/2017 1407   MCH 29.9 08/31/2023 1943   MCHC 35.5 08/31/2023 1943   RDW 12.4 08/31/2023 1943   RDW 13.1 02/11/2017 1407   LYMPHSABS 1.2  02/11/2017 1407   MONOABS 0.3 02/11/2017 1407   EOSABS 0.1 02/11/2017 1407   BASOSABS 0.0 02/11/2017 1407    No results found for: POCLITH, LITHIUM   No results found for: PHENYTOIN, PHENOBARB, VALPROATE, CBMZ   .res Assessment: Plan:   Greater than 50% of 30 min video visit  time with patient was spent on counseling and coordination of care. No psychosocial changes this visit.  We discussed his current stability. He is doing very well and is happy with his medications. Some continued family stressors but he is working through it. Does not feel like he needs psychotherapy at this time.    We agreed to:   To continue Inderal  10 mg three times daily as needed Continue Lamictal  400 mg daily Continue Xanax  1 mg daily for severe anxiety. 90 day supply this time. Explained no early fills.  Continue Trintellix  20 mg daily To follow up in 3 months Reviewed PDMP Provided emergency contact information Discussed potential benefits, risk, and side effects of benzodiazepines to include potential risk of tolerance and dependence, as well as possible drowsiness.  Advised patient not to drive if experiencing drowsiness and to take lowest possible effective dose to minimize risk of dependence and tolerance.  Reviewed PDMP  Dillon LABOR. Bauer Ausborn, Dillon Price    Delvecchio was seen today for anxiety, depression, follow-up, medication refill and patient education.  Diagnoses and all orders for this visit:  Generalized anxiety disorder -     ALPRAZolam  (XANAX ) 1 MG tablet; Take 1 tablet (1 mg total) by mouth daily as needed for anxiety. -     lamoTRIgine  (LAMICTAL ) 200 MG tablet; Take 2 tablets (400 mg total) by mouth daily. -     TRINTELLIX  20 MG TABS tablet; Take 1 tablet (20 mg total) by mouth daily. -     propranolol  (INDERAL ) 20 MG tablet; Take 1 tablet (20 mg total) by mouth 3 (three) times daily.  Major depressive disorder, recurrent episode, moderate (HCC) -     lamoTRIgine  (LAMICTAL ) 200 MG tablet; Take 2 tablets (400 mg total) by mouth daily. -     TRINTELLIX  20 MG TABS tablet; Take 1 tablet (20 mg total) by mouth daily. -     propranolol  (INDERAL ) 20 MG tablet; Take 1 tablet (20 mg total) by mouth 3 (three) times daily.     Please see After Visit Summary for patient specific  instructions.  No future appointments.  No orders of the defined types were placed in this encounter.     -------------------------------

## 2023-12-14 ENCOUNTER — Other Ambulatory Visit: Payer: Self-pay | Admitting: Behavioral Health

## 2023-12-14 DIAGNOSIS — F331 Major depressive disorder, recurrent, moderate: Secondary | ICD-10-CM

## 2023-12-14 DIAGNOSIS — F411 Generalized anxiety disorder: Secondary | ICD-10-CM

## 2024-03-24 ENCOUNTER — Other Ambulatory Visit: Payer: Self-pay | Admitting: Behavioral Health

## 2024-03-24 DIAGNOSIS — F411 Generalized anxiety disorder: Secondary | ICD-10-CM

## 2024-03-24 DIAGNOSIS — F331 Major depressive disorder, recurrent, moderate: Secondary | ICD-10-CM

## 2024-04-24 ENCOUNTER — Other Ambulatory Visit: Payer: Self-pay | Admitting: Behavioral Health

## 2024-04-24 DIAGNOSIS — F411 Generalized anxiety disorder: Secondary | ICD-10-CM

## 2024-04-24 NOTE — Telephone Encounter (Signed)
Pt has an appt 09/03

## 2024-05-13 ENCOUNTER — Encounter: Payer: Self-pay | Admitting: Behavioral Health

## 2024-05-13 ENCOUNTER — Ambulatory Visit: Admitting: Behavioral Health

## 2024-05-13 DIAGNOSIS — F411 Generalized anxiety disorder: Secondary | ICD-10-CM

## 2024-05-13 DIAGNOSIS — F331 Major depressive disorder, recurrent, moderate: Secondary | ICD-10-CM

## 2024-05-13 MED ORDER — TRINTELLIX 20 MG PO TABS
20.0000 mg | ORAL_TABLET | Freq: Every day | ORAL | 0 refills | Status: DC
Start: 2024-05-13 — End: 2024-07-30

## 2024-05-13 MED ORDER — PROPRANOLOL HCL 20 MG PO TABS: 20.0000 mg | ORAL_TABLET | Freq: Three times a day (TID) | ORAL | 1 refills | Status: DC

## 2024-05-13 MED ORDER — ALPRAZOLAM 1 MG PO TABS: 1.0000 mg | ORAL_TABLET | Freq: Two times a day (BID) | ORAL | 0 refills | Status: DC | PRN

## 2024-05-13 MED ORDER — LAMOTRIGINE 200 MG PO TABS: 400.0000 mg | ORAL_TABLET | Freq: Every day | ORAL | 1 refills | Status: DC

## 2024-05-13 NOTE — Progress Notes (Signed)
 Dillon Price [id number] May 01, 1976 48 y.o.  Virtual Visit via Telephone Note  I connected with pt on 05/13/24 at  2:30 PM EDT by telephone and verified that I am speaking with the correct person using two identifiers.   I discussed the limitations, risks, security and privacy concerns of performing an evaluation and management service by telephone and the availability of in person appointments. I also discussed with the patient that there may be a patient responsible charge related to this service. The patient expressed understanding and agreed to proceed.   I discussed the assessment and treatment plan with the patient. The patient was provided an opportunity to ask questions and all were answered. The patient agreed with the plan and demonstrated an understanding of the instructions.   The patient was advised to call back or seek an in-person evaluation if the symptoms worsen or if the condition fails to improve as anticipated.  I provided 20 minutes of non-face-to-face time during this encounter.  The patient was located at home.  The provider was located at [facility].   Dillon DELENA Pizza, NP   Subjective:   Patient ID:  Dillon Price is a 48 y.o. (DOB 1976-03-10) male.  Chief Complaint:  Chief Complaint  Patient presents with   Depression   Anxiety   Family Problem   Medication Refill   Stress   Patient Education   Follow-up    HPI  Dillon Price, 48  year old male presents to this office via tele visit for follow up and medication management. Says that he has been under increased stress and anxiety lately due to his wife having mental breakdown requiring hospitalization. Says that she had a hysterectomy and shortly thereafter started to experience depression and change in behaviors. Says she is working with OB and her psychiatrist. He says that he is managing ok but would like to temporarily increase his xanax  for a couple of months until things improve.   He says his  anxiety today is 7/10 and depression is 5/10.   He denies any current mania and cannot remember the last time occurred. He says that he sleeps 7-8 hours per night. Denies psychosis, no hx of auditory or visual hallucinations. No SI/HI.    Previous psychiatric medication trials:   Wellbutrin Zolft Prozac Klonopin Lithium Tegretol Celexa Latuda Lexapro Paxil Luvox Effexor Pristiq Abilify Seroquel Zyprexa Risperidone Depakote Lamictal  Buspar     Review of Systems:  Review of Systems  Constitutional: Negative.   Allergic/Immunologic: Negative.   Neurological: Negative.     Medications: I have reviewed the patient's current medications.  Current Outpatient Medications  Medication Sig Dispense Refill   ALPRAZolam  (XANAX ) 1 MG tablet Take 1 tablet (1 mg total) by mouth 2 (two) times daily as needed for anxiety. 180 tablet 0   ibuprofen (ADVIL,MOTRIN) 200 MG tablet Take 400 mg by mouth every 6 (six) hours as needed for moderate pain.      lamoTRIgine  (LAMICTAL ) 200 MG tablet Take 2 tablets (400 mg total) by mouth daily. 180 tablet 1   lisinopril (ZESTRIL) 10 MG tablet Take 10 mg by mouth daily.     ondansetron  (ZOFRAN ) 4 MG tablet Take 1 tablet (4 mg total) by mouth every 4 (four) hours as needed for nausea or vomiting. 12 tablet 0   oxyCODONE  (ROXICODONE ) 5 MG immediate release tablet Take 1 tablet (5 mg total) by mouth every 4 (four) hours as needed for up to 12 doses for severe pain. 12 tablet 0  pantoprazole  (PROTONIX ) 20 MG tablet Take 1 tablet (20 mg total) by mouth daily for 14 days. 14 tablet 0   propranolol  (INDERAL ) 10 MG tablet Take 1 tablet (10 mg total) by mouth 3 (three) times daily. 90 tablet 0   propranolol  (INDERAL ) 20 MG tablet Take 1 tablet (20 mg total) by mouth 3 (three) times daily. 270 tablet 1   sucralfate  (CARAFATE ) 1 g tablet Take 1 tablet (1 g total) by mouth with breakfast, with lunch, and with evening meal for 7 days. 21 tablet 0   TRINTELLIX  20  MG TABS tablet Take 1 tablet (20 mg total) by mouth daily. 90 tablet 0   verapamil (CALAN-SR) 120 MG CR tablet Take 120 mg by mouth daily.   11   No current facility-administered medications for this visit.    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   Hypertension 07/10/2016   Osteoarthritis of both hands 07/10/2016   Mild    Plantar fasciitis, left 07/10/2016    Family History  Problem Relation Age of Onset   Depression Mother    Bipolar disorder Father    Suicidality Father    Depression Paternal Uncle    Anxiety disorder Paternal Uncle    Suicidality Paternal Grandfather     Social History   Socioeconomic History   Marital status: Married    Spouse name: Not on file   Number of children: 5   Years of education: 12   Highest education level: High school graduate  Occupational History   Occupation: Education officer, museum for [employer]  Tobacco Use   Smoking status: Former   Smokeless tobacco: Never  Substance and Sexual Activity   Alcohol use: No   Drug use: Not Currently   Sexual activity: Yes  Other Topics Concern   Not on file  Social History Narrative   Lives in [place] with wife. Has 4 children from current wife and one child he later discovered as adult. He acknowledges his [employer]le[employer] faith as a big part of his life. Goes to church 3 days per week.    Social Drivers of Corporate investment banker Strain: Low Risk  (08/28/2023)   Received from [facility]   Overall Financial Resource Strain (CARDIA)    Difficulty of Paying Living Expenses: Not very hard  Food Insecurity: No Food Insecurity (08/28/2023)   Received from [facility]   Hunger Vital Sign    Within the past 12 months, you worried that your food would run out before you got the money to buy more.: Never true    Within the past 12 months, the food you bought just didn't last and you didn't have money to get more.: Never true  Transportation Needs: No Transportation Needs (08/28/2023)   Received from [facility] - Transportation    Lack of Transportation (Medical): No    Lack of Transportation (Non-Medical): No  Physical Activity: Insufficiently Active (08/28/2023)   Received from [facility]   Exercise Vital Sign    On average, how many days per week do you engage in moderate to strenuous exercise (like a brisk walk)?: 2 days    On average, how many  minutes do you engage in exercise at this level?: 60 min  Stress: Stress Concern Present (08/28/2023)   Received from [facility] of Occupational Health - Occupational Stress Questionnaire    Feeling of Stress : Very much  Social Connections: Somewhat Isolated (08/28/2023)   Received from [facility]   Social Network    How would you rate your social network (family, work, friends)?: Restricted participation with some degree of social isolation  Intimate Partner Violence: Not At Risk (08/28/2023)   Received from [facility]   HITS    Over the last 12 months how often did your partner physically hurt you?: Never    Over the last 12 months how often did your partner insult you or talk down to you?: Rarely    Over the last 12 months how often did your partner threaten you with physical harm?: Never    Over the last 12 months how often did your partner scream or curse at you?: Rarely    Past Medical History, Surgical history, Social history, and Family history were reviewed and updated as appropriate.   Please see review of systems for further details on the patient's review from today.   Objective:   Physical Exam:  There were no vitals taken for this  visit.  Physical Exam  Lab Review:     Component Value Date/Time   NA 140 08/31/2023 1943   NA 141 02/11/2017 1407   K 3.4 (L) 08/31/2023 1943   K 3.9 02/11/2017 1407   CL 103 08/31/2023 1943   CO2 30 08/31/2023 1943   CO2 28 02/11/2017 1407   GLUCOSE 109 (H) 08/31/2023 1943   GLUCOSE 105 02/11/2017 1407   BUN 16 08/31/2023 1943   BUN 11.4 02/11/2017 1407   CREATININE 0.95 08/31/2023 1943   CREATININE 0.9 02/11/2017 1407   CALCIUM 9.7 08/31/2023 1943   CALCIUM 9.3 02/11/2017 1407   PROT 8.0 12/04/2022 1128   PROT 7.0 02/11/2017 1407   PROT 7.5 02/11/2017 1407   ALBUMIN 5.0 12/04/2022 1128   ALBUMIN 4.3 02/11/2017 1407   AST 13 (L) 12/04/2022 1128   AST 16 02/11/2017 1407   ALT 12 12/04/2022 1128   ALT 22 02/11/2017 1407   ALKPHOS 87 12/04/2022 1128   ALKPHOS 91 02/11/2017 1407   BILITOT 0.5 12/04/2022 1128   BILITOT 0.27 02/11/2017 1407   GFRNONAA >60 08/31/2023 1943   GFRAA >60 02/25/2011 2146       Component Value Date/Time   WBC 5.9 08/31/2023 1943   RBC 5.21 08/31/2023 1943   HGB 15.6 08/31/2023 1943   HGB 14.1 02/11/2017 1407   HCT 44.0 08/31/2023 1943   HCT 41.9 02/11/2017 1407   PLT 245 08/31/2023 1943   PLT 238 02/11/2017 1407   MCV 84.5 08/31/2023 1943   MCV 85.0 02/11/2017 1407   MCH 29.9 08/31/2023 1943   MCHC 35.5 08/31/2023 1943   RDW 12.4 08/31/2023 1943   RDW 13.1 02/11/2017 1407   LYMPHSABS 1.2 02/11/2017 1407   MONOABS 0.3 02/11/2017 1407   EOSABS 0.1 02/11/2017 1407   BASOSABS 0.0 02/11/2017 1407    No results found for: POCLITH, LITHIUM   No results found for: PHENYTOIN, PHENOBARB, VALPROATE, CBMZ   .res Assessment: Plan:   Greater than 50% of 30 min telephonic  time with patient was spent on counseling and coordination of care. Discussed his extreme stress and duration due to wife's mental status. He is having to bear the  brunt of taking care of kids and home right now. At risk for caregiver role strain. Reviewed  medications and agreed to temp increase his xanax  for a couple of months. Pt understands that this is temporary and will decrease back down when situation improves.     We agreed to:   To continue Inderal  10 mg three times daily as needed Continue Lamictal  400 mg daily Increase Xanax  to 2 mg daily for severe anxiety divided into two doses.  90 day supply this time. Explained no early fills.  Continue Trintellix  20 mg daily To follow up in 3 months Reviewed PDMP Provided emergency contact information Discussed potential benefits, risk, and side effects of benzodiazepines to include potential risk of tolerance and dependence, as well as possible drowsiness.  Advised patient not to drive if experiencing drowsiness and to take lowest possible effective dose to minimize risk of dependence and tolerance.  Reviewed PDMP      Dillon Price was seen today for depression, anxiety, family problem, medication refill, stress, patient education and follow-up.  Diagnoses and all orders for this visit:  Generalized anxiety disorder -     ALPRAZolam  (XANAX ) 1 MG tablet; Take 1 tablet (1 mg total) by mouth 2 (two) times daily as needed for anxiety. -     lamoTRIgine  (LAMICTAL ) 200 MG tablet; Take 2 tablets (400 mg total) by mouth daily. -     TRINTELLIX  20 MG TABS tablet; Take 1 tablet (20 mg total) by mouth daily. -     propranolol  (INDERAL ) 20 MG tablet; Take 1 tablet (20 mg total) by mouth 3 (three) times daily.  Major depressive disorder, recurrent episode, moderate (HCC) -     lamoTRIgine  (LAMICTAL ) 200 MG tablet; Take 2 tablets (400 mg total) by mouth daily. -     TRINTELLIX  20 MG TABS tablet; Take 1 tablet (20 mg total) by mouth daily. -     propranolol  (INDERAL ) 20 MG tablet; Take 1 tablet (20 mg total) by mouth 3 (three) times daily.    Please see After Visit Summary for patient specific instructions.  No future appointments.  No orders of the defined types were placed in this encounter.      -------------------------------

## 2024-05-18 ENCOUNTER — Other Ambulatory Visit: Payer: Self-pay | Admitting: Behavioral Health

## 2024-05-18 DIAGNOSIS — F331 Major depressive disorder, recurrent, moderate: Secondary | ICD-10-CM

## 2024-05-18 DIAGNOSIS — F411 Generalized anxiety disorder: Secondary | ICD-10-CM

## 2024-07-28 ENCOUNTER — Other Ambulatory Visit: Payer: Self-pay | Admitting: Behavioral Health

## 2024-07-28 DIAGNOSIS — F411 Generalized anxiety disorder: Secondary | ICD-10-CM

## 2024-07-28 DIAGNOSIS — F331 Major depressive disorder, recurrent, moderate: Secondary | ICD-10-CM

## 2024-08-26 ENCOUNTER — Telehealth: Admitting: Behavioral Health

## 2024-08-26 ENCOUNTER — Encounter: Payer: Self-pay | Admitting: Behavioral Health

## 2024-08-26 DIAGNOSIS — F331 Major depressive disorder, recurrent, moderate: Secondary | ICD-10-CM | POA: Diagnosis not present

## 2024-08-26 DIAGNOSIS — F411 Generalized anxiety disorder: Secondary | ICD-10-CM | POA: Diagnosis not present

## 2024-08-26 MED ORDER — TRINTELLIX 20 MG PO TABS: 20.0000 mg | ORAL_TABLET | Freq: Every day | ORAL | 0 refills | Status: AC

## 2024-08-26 MED ORDER — ALPRAZOLAM 1 MG PO TABS: 1.0000 mg | ORAL_TABLET | Freq: Two times a day (BID) | ORAL | 2 refills | Status: AC | PRN

## 2024-08-26 MED ORDER — LAMOTRIGINE 200 MG PO TABS: 400.0000 mg | ORAL_TABLET | Freq: Every day | ORAL | 1 refills | Status: AC

## 2024-08-26 MED ORDER — PROPRANOLOL HCL 20 MG PO TABS: 20.0000 mg | ORAL_TABLET | Freq: Three times a day (TID) | ORAL | 1 refills | Status: AC

## 2024-08-26 NOTE — Progress Notes (Signed)
 Dillon Price [id number] 1976/05/17 48 y.o.  Virtual Visit via Video Note  I connected with pt @ on 08/26/2024 at  9:00 AM EST by a video enabled telemedicine application and verified that I am speaking with the correct person using two identifiers.   I discussed the limitations of evaluation and management by telemedicine and the availability of in person appointments. The patient expressed understanding and agreed to proceed.  I discussed the assessment and treatment plan with the patient. The patient was provided an opportunity to ask questions and all were answered. The patient agreed with the plan and demonstrated an understanding of the instructions.   The patient was advised to call back or seek an in-person evaluation if the symptoms worsen or if the condition fails to improve as anticipated.  I provided 30 minutes of non-face-to-face time during this encounter.  The patient was located at home.  The provider was located at [facility].   Dillon DELENA Pizza, NP   Subjective:   Patient ID:  Dillon Price is a 48 y.o. (DOB [birth year]) male.  Chief Complaint:  Chief Complaint  Patient presents with   Depression   Anxiety   Follow-up   Medication Refill   Family Problem   Patient Education   Stress    HPI  Dillon Price, 48  year old male presents to this office via tele visit for follow up and medication management. Says that he has been under increased stress and anxiety lately due to his wife having mental breakdown requiring hospitalization.  She has been in and out of programs for the last 3 months. He has had to bear the brunt of all the responsibility at home taking care of children while working full time. He is under a extreme amount of stress.  Says that she had a hysterectomy and shortly thereafter started to experience depression and change in behaviors. Says she is working with OB and her psychiatrist. He has been able to get some assistance from family and his  church.    He says his anxiety today is 5/10 and depression is 6/10.  Reports sleeping okay.  Appetite good.   He denies any current mania and cannot remember the last time occurred. He says that he sleeps 7-8 hours per night. Denies psychosis, no hx of auditory or visual hallucinations. No SI/HI.    Previous psychiatric medication trials:   Wellbutrin Zolft Prozac Klonopin Lithium Tegretol Celexa Latuda Lexapro Paxil Luvox Effexor Pristiq Abilify Seroquel Zyprexa Risperidone Depakote Lamictal  Buspar     Review of Systems:  Review of Systems  Constitutional: Negative.   Allergic/Immunologic: Negative.   Neurological: Negative.   Psychiatric/Behavioral:  Positive for decreased concentration and dysphoric mood.     Medications: I have reviewed the patient's current medications.  Current Outpatient Medications  Medication Sig Dispense Refill   ALPRAZolam  (XANAX ) 1 MG tablet Take 1 tablet (1 mg total) by mouth 2 (two) times daily as needed for anxiety. 180 tablet 2   lamoTRIgine  (LAMICTAL ) 200 MG tablet Take 2 tablets (400 mg total) by mouth daily. 180 tablet 1   lisinopril (ZESTRIL) 10 MG tablet Take 10 mg by mouth daily.     propranolol  (INDERAL ) 20 MG tablet Take 1 tablet (20 mg total) by mouth 3 (three) times daily. 270 tablet 1   TRINTELLIX  20 MG TABS tablet Take 1 tablet (20 mg total) by mouth daily. 90 tablet 0   verapamil (CALAN-SR) 120 MG CR tablet Take 120 mg by mouth daily.  11   No current facility-administered medications for this visit.    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   Hypertension 07/10/2016   Osteoarthritis of both hands 07/10/2016    Mild    Plantar fasciitis, left 07/10/2016    Family History  Problem Relation Age of Onset   Depression Mother    Bipolar disorder Father    Suicidality Father    Depression Paternal Uncle    Anxiety disorder Paternal Uncle    Suicidality Paternal Grandfather     Social History   Socioeconomic History   Marital status: Married    Spouse name: Not on file   Number of children: 5   Years of education: 12   Highest education level: High school graduate  Occupational History   Occupation: Education Officer, [employer]  Tobacco Use   Smoking status: Former   Smokeless tobacco: Never  Substance and Sexual Activity   Alcohol use: No   Drug use: Not Currently   Sexual activity: Yes  Other Topics Concern   Not on file  Social History Narrative   Lives in [place] with wife. Has 4 children from current wife and one child he later discovered as adult. He acknowledges his Dillon Price faith as a big part of his life. Goes to church 3 days per week.    Social Drivers of Health   Tobacco Use: Medium Risk (05/13/2024)   Patient History    Smoking Tobacco Use: Former    Smokeless Tobacco Use: Never    Passive Exposure: Not on file  Financial Resource Strain: Low Risk (08/28/2023)   Received from [facility]   Overall Financial Resource Strain (CARDIA)    Difficulty of Paying Living Expenses: Not very hard  Food Insecurity: No Food Insecurity (08/28/2023)   Received from [facility]   Epic    Within the past 12 months, you worried that your food would run out before you got the money to buy more.: Never true    Within the past 12 months, the food you bought just didn't last and you didn't have money to get more.: Never true  Transportation Needs: No Transportation Needs (08/28/2023)   Received from [facility] - Transportation    Lack of Transportation (Medical): No    Lack of Transportation (Non-Medical): No  Physical Activity:  Insufficiently Active (08/28/2023)   Received from [facility]   Exercise Vital Sign    On average, how many days per week do you engage in moderate to strenuous exercise (like a brisk walk)?: 2 days    On average, how many minutes do you engage in exercise at this level?: 60 min  Stress: Stress Concern Present (08/28/2023)   Received from [facility] - Occupational Stress Questionnaire    Feeling of Stress : Very much  Social Connections: Somewhat Isolated (08/28/2023)   Received from [facility]   Social Network    How would you rate your social network (family, work, friends)?: Restricted participation with some degree of social isolation  Intimate Partner Violence: Not At Risk (08/28/2023)   Received from [facility]   HITS    Over  the last 12 months how often did your partner physically hurt you?: Never    Over the last 12 months how often did your partner insult you or talk down to you?: Rarely    Over the last 12 months how often did your partner threaten you with physical harm?: Never    Over the last 12 months how often did your partner scream or curse at you?: Rarely  Depression (PHQ2-9): High Risk (03/15/2022)   Depression (PHQ2-9)    PHQ-2 Score: 11  Alcohol Screen: Not on file  Housing: Low Risk (08/28/2023)   Received from [facility]    In the last 12 months, was there a time when you were not able to pay the mortgage or rent on time?: No    In the past 12 months, how many times have you moved where you were living?: 0    At any time in the past 12 months, were you homeless or living in a shelter (including now)?: No  Utilities: Not At Risk (08/28/2023)   Received from [facility] Utilities    Threatened with loss of utilities: No  Health Literacy: Not on file    Past Medical History, Surgical history, Social history, and Family history were reviewed and updated as appropriate.   Please see review  of systems for further details on the patient's review from today.   Objective:   Physical Exam:  There were no vitals taken for this visit.  Physical Exam  Lab Review:     Component Value Date/Time   NA 140 08/31/2023 1943   NA 141 02/11/2017 1407   K 3.4 (L) 08/31/2023 1943   K 3.9 02/11/2017 1407   CL 103 08/31/2023 1943   CO2 30 08/31/2023 1943   CO2 28 02/11/2017 1407   GLUCOSE 109 (H) 08/31/2023 1943   GLUCOSE 105 02/11/2017 1407   BUN 16 08/31/2023 1943   BUN 11.4 02/11/2017 1407   CREATININE 0.95 08/31/2023 1943   CREATININE 0.9 02/11/2017 1407   CALCIUM 9.7 08/31/2023 1943   CALCIUM 9.3 02/11/2017 1407   PROT 8.0 12/04/2022 1128   PROT 7.0 02/11/2017 1407   PROT 7.5 02/11/2017 1407   ALBUMIN 5.0 12/04/2022 1128   ALBUMIN 4.3 02/11/2017 1407   AST 13 (L) 12/04/2022 1128   AST 16 02/11/2017 1407   ALT 12 12/04/2022 1128   ALT 22 02/11/2017 1407   ALKPHOS 87 12/04/2022 1128   ALKPHOS 91 02/11/2017 1407   BILITOT 0.5 12/04/2022 1128   BILITOT 0.27 02/11/2017 1407   GFRNONAA >60 08/31/2023 1943   GFRAA >60 02/25/2011 2146       Component Value Date/Time   WBC 5.9 08/31/2023 1943   RBC 5.21 08/31/2023 1943   HGB 15.6 08/31/2023 1943   HGB 14.1 02/11/2017 1407   HCT 44.0 08/31/2023 1943   HCT 41.9 02/11/2017 1407   PLT 245 08/31/2023 1943   PLT 238 02/11/2017 1407   MCV 84.5 08/31/2023 1943   MCV 85.0 02/11/2017 1407   MCH 29.9 08/31/2023 1943   MCHC 35.5 08/31/2023 1943   RDW 12.4 08/31/2023 1943   RDW 13.1 02/11/2017 1407   LYMPHSABS 1.2 02/11/2017 1407   MONOABS 0.3 02/11/2017 1407   EOSABS 0.1 02/11/2017 1407   BASOSABS 0.0 02/11/2017 1407    No results found for: POCLITH, LITHIUM   No results found for: PHENYTOIN, PHENOBARB, VALPROATE, CBMZ   .res Assessment: Plan:    Greater than  50% of 30 min video visit with patient was spent on counseling and coordination of care. Discussed his extreme stress and duration due to wife's  mental status. He is having to bear the brunt of taking care of kids and home right now. At risk for caregiver role strain. Reviewed medications and agreed to temp increase his xanax  for a couple of months. Pt understands that this is temporary and will decrease back down when situation improves.     We agreed to:   To continue Inderal  10 mg three times daily as needed Continue Lamictal  400 mg daily Increase Xanax  to 2 mg daily for severe anxiety divided into two doses.  90 day supply this time. Explained no early fills.  Continue Trintellix  20 mg daily To follow up in 3 months Reviewed PDMP Provided emergency contact information Discussed potential benefits, risk, and side effects of benzodiazepines to include potential risk of tolerance and dependence, as well as possible drowsiness.  Advised patient not to drive if experiencing drowsiness and to take lowest possible effective dose to minimize risk of dependence and tolerance.  Reviewed PDMP      Dillon LABOR. Sylvanna Burggraf, NP      Fleming was seen today for depression, anxiety, follow-up, medication refill, family problem, patient education and stress.  Diagnoses and all orders for this visit:  Generalized anxiety disorder -     ALPRAZolam  (XANAX ) 1 MG tablet; Take 1 tablet (1 mg total) by mouth 2 (two) times daily as needed for anxiety. -     lamoTRIgine  (LAMICTAL ) 200 MG tablet; Take 2 tablets (400 mg total) by mouth daily. -     TRINTELLIX  20 MG TABS tablet; Take 1 tablet (20 mg total) by mouth daily. -     propranolol  (INDERAL ) 20 MG tablet; Take 1 tablet (20 mg total) by mouth 3 (three) times daily.  Major depressive disorder, recurrent episode, moderate (HCC) -     lamoTRIgine  (LAMICTAL ) 200 MG tablet; Take 2 tablets (400 mg total) by mouth daily. -     TRINTELLIX  20 MG TABS tablet; Take 1 tablet (20 mg total) by mouth daily. -     propranolol  (INDERAL ) 20 MG tablet; Take 1 tablet (20 mg total) by mouth 3 (three) times daily.      Please see After Visit Summary for patient specific instructions.  No future appointments.  No orders of the defined types were placed in this encounter.     -------------------------------      [1] No Known Allergies
